# Patient Record
Sex: Female | Born: 1960 | Race: White | Hispanic: No | Marital: Single | Smoking: Current every day smoker
Health system: Southern US, Community
[De-identification: ages and names within clinical notes are randomized; demographics above are authoritative.]

## PROBLEM LIST (undated history)

## (undated) DIAGNOSIS — J45909 Unspecified asthma, uncomplicated: Secondary | ICD-10-CM

## (undated) DIAGNOSIS — F419 Anxiety disorder, unspecified: Secondary | ICD-10-CM

## (undated) DIAGNOSIS — M479 Spondylosis, unspecified: Secondary | ICD-10-CM

## (undated) DIAGNOSIS — M1990 Unspecified osteoarthritis, unspecified site: Secondary | ICD-10-CM

## (undated) DIAGNOSIS — F32A Depression, unspecified: Secondary | ICD-10-CM

## (undated) DIAGNOSIS — T7840XA Allergy, unspecified, initial encounter: Secondary | ICD-10-CM

## (undated) DIAGNOSIS — F329 Major depressive disorder, single episode, unspecified: Secondary | ICD-10-CM

## (undated) HISTORY — DX: Unspecified asthma, uncomplicated: J45.909

## (undated) HISTORY — DX: Major depressive disorder, single episode, unspecified: F32.9

## (undated) HISTORY — DX: Unspecified osteoarthritis, unspecified site: M19.90

## (undated) HISTORY — DX: Allergy, unspecified, initial encounter: T78.40XA

## (undated) HISTORY — DX: Anxiety disorder, unspecified: F41.9

## (undated) HISTORY — DX: Depression, unspecified: F32.A

---

## 1998-04-11 HISTORY — PX: TOTAL ABDOMINAL HYSTERECTOMY: SHX209

## 2004-07-29 ENCOUNTER — Emergency Department: Payer: Self-pay | Admitting: Emergency Medicine

## 2014-03-31 ENCOUNTER — Emergency Department: Payer: Self-pay | Admitting: Emergency Medicine

## 2014-03-31 LAB — URINALYSIS, COMPLETE
BACTERIA: NONE SEEN
BILIRUBIN, UR: NEGATIVE
Glucose,UR: NEGATIVE mg/dL (ref 0–75)
Ketone: NEGATIVE
Leukocyte Esterase: NEGATIVE
Nitrite: NEGATIVE
PROTEIN: NEGATIVE
Ph: 6 (ref 4.5–8.0)
SPECIFIC GRAVITY: 1.005 (ref 1.003–1.030)
WBC UR: NONE SEEN /HPF (ref 0–5)

## 2014-12-12 ENCOUNTER — Other Ambulatory Visit: Payer: Self-pay | Admitting: Family Medicine

## 2014-12-12 DIAGNOSIS — M1990 Unspecified osteoarthritis, unspecified site: Secondary | ICD-10-CM | POA: Insufficient documentation

## 2014-12-12 DIAGNOSIS — K529 Noninfective gastroenteritis and colitis, unspecified: Secondary | ICD-10-CM | POA: Insufficient documentation

## 2014-12-12 DIAGNOSIS — J9801 Acute bronchospasm: Secondary | ICD-10-CM | POA: Insufficient documentation

## 2014-12-12 DIAGNOSIS — M549 Dorsalgia, unspecified: Secondary | ICD-10-CM

## 2014-12-12 DIAGNOSIS — Z8541 Personal history of malignant neoplasm of cervix uteri: Secondary | ICD-10-CM | POA: Insufficient documentation

## 2014-12-12 DIAGNOSIS — G8929 Other chronic pain: Secondary | ICD-10-CM | POA: Insufficient documentation

## 2014-12-12 DIAGNOSIS — M5412 Radiculopathy, cervical region: Secondary | ICD-10-CM | POA: Insufficient documentation

## 2014-12-12 DIAGNOSIS — F329 Major depressive disorder, single episode, unspecified: Secondary | ICD-10-CM | POA: Insufficient documentation

## 2014-12-12 DIAGNOSIS — B009 Herpesviral infection, unspecified: Secondary | ICD-10-CM | POA: Insufficient documentation

## 2014-12-12 DIAGNOSIS — F419 Anxiety disorder, unspecified: Secondary | ICD-10-CM

## 2014-12-12 DIAGNOSIS — F17210 Nicotine dependence, cigarettes, uncomplicated: Secondary | ICD-10-CM | POA: Insufficient documentation

## 2014-12-12 DIAGNOSIS — K219 Gastro-esophageal reflux disease without esophagitis: Secondary | ICD-10-CM | POA: Insufficient documentation

## 2014-12-13 ENCOUNTER — Ambulatory Visit (INDEPENDENT_AMBULATORY_CARE_PROVIDER_SITE_OTHER): Payer: BLUE CROSS/BLUE SHIELD | Admitting: Family Medicine

## 2014-12-13 ENCOUNTER — Encounter: Payer: Self-pay | Admitting: Family Medicine

## 2014-12-13 VITALS — BP 108/60 | HR 107 | Temp 98.2°F | Resp 16 | Ht 64.0 in | Wt 144.2 lb

## 2014-12-13 DIAGNOSIS — M5412 Radiculopathy, cervical region: Secondary | ICD-10-CM

## 2014-12-13 DIAGNOSIS — F418 Other specified anxiety disorders: Secondary | ICD-10-CM

## 2014-12-13 DIAGNOSIS — F419 Anxiety disorder, unspecified: Principal | ICD-10-CM

## 2014-12-13 DIAGNOSIS — F329 Major depressive disorder, single episode, unspecified: Secondary | ICD-10-CM

## 2014-12-13 DIAGNOSIS — F17210 Nicotine dependence, cigarettes, uncomplicated: Secondary | ICD-10-CM

## 2014-12-13 DIAGNOSIS — Z720 Tobacco use: Secondary | ICD-10-CM | POA: Diagnosis not present

## 2014-12-13 DIAGNOSIS — G8929 Other chronic pain: Secondary | ICD-10-CM

## 2014-12-13 DIAGNOSIS — Z1231 Encounter for screening mammogram for malignant neoplasm of breast: Secondary | ICD-10-CM | POA: Insufficient documentation

## 2014-12-13 DIAGNOSIS — M549 Dorsalgia, unspecified: Secondary | ICD-10-CM

## 2014-12-13 MED ORDER — TRAMADOL HCL 50 MG PO TABS: 50.0000 mg | ORAL_TABLET | Freq: Four times a day (QID) | ORAL | Status: DC | PRN

## 2014-12-13 NOTE — Progress Notes (Signed)
Name: Tracy Winters   MRN: [id number]    DOB: Aug 22, 1960   Date:12/13/2014       Progress Note  Subjective  Chief Complaint  Chief Complaint  Patient presents with  . Anxiety    refill of medication  . Numbness    bilateral hands and radiates to elbow at times  . Back Pain    refill of medication    HPI  Patient is here for routine follow up of Asthma. Age of onset? Childhood Asthma triggers? Environmental, URIs Current Asthma medication regimen? Albuterol rescue inhaler only when needed Using medications as instructed? yes Feels symptoms are well controlled? yes Night time symptoms? no  ER visits since last visit: no Missed work or school: no Increased wheezing: no Increased cough: no Increased SOB: no  The patient is here today to discuss their joint pain. Is this a chronic problem or an acute problem? Chronic back pain. More recently having some numbness and tingling in bilateral hands/arms. When did the pain start? Years ago. Where is the pain located? Lower back How would you describe the pain? Constant dull ache. Does the pain radiate beyond place of origin? No During a typical day what is the pattern of the pain? Some days better than others. What makes the pain better? Rest, medication. What makes the pain worse? Increased activity, increased stress. Is patient able to function at home, work, school? Yes Is it getting better, worse or staying the same? Staying the same. What medications have you tried to help manage your pain? NSAIDs, opioids, tramadol.   Have you have X-rays, MRI, CT Scan related to the area of pain? Yes X-rays   Patient is also here to discuss mood disorder.  In general symptoms are stable, reasonably well controlled and no significant medication side effects noted. Depression symptoms include feeling down, poor concentration, anxiety, worrying. Anxiety symptoms include difficulty concentrating, feelings of losing control, irritable.  Any  family history of depression? yes What medications has the patient tried for mood disorder? Currently on Celexa and Ativan as needed. Any current suicidal or homicidal ideations? no  Would also like a referral for her mammogram.    Patient Active Problem List   Diagnosis Date Noted  . Anxiety and depression 12/12/2014  . 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 Acid reflux 12/12/2014  . Cigarette smoker 12/12/2014  . Back pain, chronic 12/12/2014  . Arthritis 12/12/2014  . Radiculopathy of cervical spine 12/12/2014  . Cancer 12/12/2014  . Acute bronchospasm 12/12/2014  . Acute gastroenteritis 12/12/2014    History  Substance Use Topics  . Smoking status: Current Every Day Smoker    Types: Cigarettes  . Smokeless tobacco: Not on file  . Alcohol Use: No     Current outpatient prescriptions:  .  albuterol (PROVENTIL HFA;VENTOLIN HFA) 108 (90 BASE) MCG/ACT inhaler, Inhale 1-2 puffs into the lungs every 6 (six) hours as needed., Disp: , Rfl:  .  citalopram (CELEXA) 20 MG tablet, Take 1 tablet by mouth daily., Disp: , Rfl:  .  LORazepam (ATIVAN) 0.5 MG tablet, Take 1 tablet by mouth 2 (two) times daily as needed., Disp: , Rfl:  .  traMADol (ULTRAM) 50 MG tablet, Take 1 tablet by mouth every 6 (six) hours as needed., Disp: , Rfl:   Past Surgical History  Procedure Laterality Date  . Total abdominal hysterectomy  04/1998    Family History  Problem Relation Age of Onset  . Alcohol abuse Mother   .  COPD Mother   . Depression Mother   . Alcohol abuse Father   . Arthritis Father   . Diabetes Father   . Heart disease Father   . Hypertension Father   . Drug abuse Sister   . Hearing loss Son   . Heart disease Maternal Aunt   . Hyperlipidemia Maternal Aunt   . Asthma Paternal Aunt   . Cancer Paternal Aunt   . Diabetes Paternal Aunt   . Hypertension Paternal Aunt   . Stroke Paternal Aunt   . Arthritis Maternal Grandfather   . Heart disease Maternal Grandfather   .  Hypertension Maternal Grandfather   . Asthma Paternal Grandmother   . Diabetes Paternal Grandmother   . Heart disease Paternal Grandmother   . Stroke Paternal Grandmother   . Heart disease Paternal Grandfather     Allergies  Allergen Reactions  . Sulfa Antibiotics Swelling and Anaphylaxis    swollen throat  . Tomato Hives    Patient stated that she can eat tomatoes in foods but she cannot eat a plain tomato.     Review of Systems  CONSTITUTIONAL: No significant weight changes, fever, chills, weakness.  HEENT:  - Eyes: No visual changes.  - Ears: No auditory changes. No pain.  - Nose: No sneezing, congestion, runny nose. - Throat: No sore throat. No changes in swallowing. SKIN: No rash or itching.  CARDIOVASCULAR: No chest pain, chest pressure or chest discomfort. No palpitations or edema.  RESPIRATORY: No shortness of breath, cough or sputum.  NEUROLOGICAL: No headache, dizziness, syncope, paralysis, ataxia. Positive for numbness or tingling in the upper extremities. No memory changes. No change in bowel or bladder control.  MUSCULOSKELETAL: Chronic back joint pain. No muscle pain. HEMATOLOGIC: No anemia, bleeding or bruising.  LYMPHATICS: No enlarged lymph nodes.  PSYCHIATRIC: No change in mood more than usual. No change in sleep pattern.  ENDOCRINOLOGIC: No reports of sweating, cold or heat intolerance. No polyuria or polydipsia.      Objective  BP 108/60 mmHg  Pulse 107  Temp(Src) 98.2 F (36.8 C) (Oral)  Resp 16  Ht 5\' 4"  (1.626 m)  Wt 144 lb 3.2 oz (65.409 kg)  BMI 24.74 kg/m2  SpO2 96%  Physical Exam  Constitutional: Patient appears well-developed and well-nourished. In no distress.  HEENT:  - Head: Normocephalic and atraumatic.  - Ears: Bilateral TMs gray, no erythema or effusion - Nose: Nasal mucosa moist - Mouth/Throat: Oropharynx is clear and moist. No tonsillar hypertrophy or erythema. No post nasal drainage.  - Eyes: Conjunctivae clear, EOM  movements normal. PERRLA. No scleral icterus.  Neck: Normal range of motion. Neck supple. No JVD present. No thyromegaly present.  Cardiovascular: Normal rate, regular rhythm and normal heart sounds.  No murmur heard.  Pulmonary/Chest: Effort normal and breath sounds normal. No respiratory distress. Musculoskeletal: Normal range of motion bilateral UE and LE, no joint effusions. Continues to have some lower lumbar paraspinal muscle tension. Peripheral vascular: Bilateral LE no edema. Neurological: CN II-XII grossly intact with no focal deficits. Positive Phalen's test bilaterally. Alert and oriented to person, place, and time. Coordination, balance, strength, speech and gait are normal.  Skin: Skin is warm and dry. No rash noted. No erythema.  Psychiatric: Patient's mood and affect are stable. Behavior is normal in office today. Judgment and thought content normal in office today.    Assessment & Plan  1. Anxiety and depression Well controlled on Citalopram 20mg  daily and Ativan 0.5mg  po bit prn. No refills  needed today.  2. Cigarette smoker The patient has been counseled on smoking cessation benefits, goals, strategies and available over the counter and prescription medications that may help them in their efforts.  Options discussed include Nicoderm patches, Wellbutrin and Chantix.  The patient voices understanding their increased risk of cardiovascular and pulmonary diseases with continued use of tobacco products.   3. Back pain, chronic We discussed potential pathology and long term risk of reoccurrence. Maintaining an ideal body habitus, regular exercise, proper lifting techniques and mindfulness of exacerbating factors will be useful in long term management.  Instructed on use of heating pad with exercises. Consider concomitant therapy with PT, massage therapist or chiropractor. May use anti-inflammatory medication and muscle relaxer as needed.  4. Radiculopathy of cervical  spine Numbness in hands may be a manifestation of long standing C-spine issues. Continue conservative therapy.  5. Encounter for screening mammogram for breast cancer  - MM DIGITAL SCREENING BILATERAL; Future

## 2014-12-20 ENCOUNTER — Ambulatory Visit: Payer: Self-pay | Attending: Family Medicine

## 2014-12-24 ENCOUNTER — Telehealth: Payer: Self-pay | Admitting: Family Medicine

## 2014-12-24 NOTE — Telephone Encounter (Signed)
Please contact patient at your convenience and let me know how I may be of service.

## 2014-12-24 NOTE — Telephone Encounter (Signed)
Pt is requesting a return call [phone]

## 2014-12-25 ENCOUNTER — Other Ambulatory Visit: Payer: Self-pay

## 2014-12-25 DIAGNOSIS — M5440 Lumbago with sciatica, unspecified side: Secondary | ICD-10-CM

## 2014-12-25 MED ORDER — CYCLOBENZAPRINE HCL 10 MG PO TABS: 10.0000 mg | ORAL_TABLET | Freq: Three times a day (TID) | ORAL | Status: DC | PRN

## 2014-12-25 NOTE — Telephone Encounter (Signed)
Rx was electronically submitted to [facility] in [place], [place]

## 2014-12-25 NOTE — Telephone Encounter (Signed)
A week ago last Sunday, patient pulled some muscles in her back and was taken out of work (worker's comp). Patient was given a rx for Flexeril 10mg  and since it helped she wanted to know if Dr. Sherley Bounds could send in another rx for to [facility] in [place]. After consulting with Dr. Sherley Bounds, a rx was electronically submitted. Patient was informed

## 2015-03-12 ENCOUNTER — Ambulatory Visit (INDEPENDENT_AMBULATORY_CARE_PROVIDER_SITE_OTHER): Payer: BLUE CROSS/BLUE SHIELD | Admitting: Family Medicine

## 2015-03-12 ENCOUNTER — Encounter: Payer: Self-pay | Admitting: Family Medicine

## 2015-03-12 VITALS — BP 130/76 | HR 103 | Temp 98.4°F | Resp 18 | Wt 152.7 lb

## 2015-03-12 DIAGNOSIS — G8929 Other chronic pain: Secondary | ICD-10-CM

## 2015-03-12 DIAGNOSIS — S29019D Strain of muscle and tendon of unspecified wall of thorax, subsequent encounter: Secondary | ICD-10-CM

## 2015-03-12 DIAGNOSIS — F418 Other specified anxiety disorders: Secondary | ICD-10-CM

## 2015-03-12 DIAGNOSIS — R319 Hematuria, unspecified: Secondary | ICD-10-CM | POA: Diagnosis not present

## 2015-03-12 DIAGNOSIS — M549 Dorsalgia, unspecified: Secondary | ICD-10-CM

## 2015-03-12 DIAGNOSIS — F419 Anxiety disorder, unspecified: Secondary | ICD-10-CM

## 2015-03-12 DIAGNOSIS — R911 Solitary pulmonary nodule: Secondary | ICD-10-CM | POA: Diagnosis not present

## 2015-03-12 DIAGNOSIS — F329 Major depressive disorder, single episode, unspecified: Secondary | ICD-10-CM

## 2015-03-12 DIAGNOSIS — S29012D Strain of muscle and tendon of back wall of thorax, subsequent encounter: Secondary | ICD-10-CM

## 2015-03-12 DIAGNOSIS — S29012A Strain of muscle and tendon of back wall of thorax, initial encounter: Secondary | ICD-10-CM | POA: Insufficient documentation

## 2015-03-12 DIAGNOSIS — Z716 Tobacco abuse counseling: Secondary | ICD-10-CM

## 2015-03-12 DIAGNOSIS — Z720 Tobacco use: Secondary | ICD-10-CM

## 2015-03-12 DIAGNOSIS — F32A Depression, unspecified: Secondary | ICD-10-CM

## 2015-03-12 MED ORDER — LORAZEPAM 0.5 MG PO TABS: 0.5000 mg | ORAL_TABLET | Freq: Two times a day (BID) | ORAL | Status: DC | PRN

## 2015-03-12 MED ORDER — OXYCODONE-ACETAMINOPHEN 5-325 MG PO TABS: 1.0000 | ORAL_TABLET | Freq: Three times a day (TID) | ORAL | Status: DC | PRN

## 2015-03-12 MED ORDER — CYCLOBENZAPRINE HCL 10 MG PO TABS: 10.0000 mg | ORAL_TABLET | Freq: Three times a day (TID) | ORAL | Status: DC | PRN

## 2015-03-12 MED ORDER — TRAMADOL HCL 50 MG PO TABS: 50.0000 mg | ORAL_TABLET | Freq: Four times a day (QID) | ORAL | Status: DC | PRN

## 2015-03-12 MED ORDER — NICOTINE 14 MG/24HR TD PT24: 14.0000 mg | MEDICATED_PATCH | Freq: Every day | TRANSDERMAL | Status: DC

## 2015-03-12 NOTE — Progress Notes (Signed)
Name: Tracy Winters   MRN: [id number]    DOB: [birth year]   Date:03/12/2015       Progress Note  Subjective  Chief Complaint  Chief Complaint  Patient presents with  . Back Pain    patient is here for a ER follow-up on her lower lumbar strain  . Results    patient was told while at hospital that she has a 4mm nodule on her left lung.    HPI  Tracy Winters is a 54 year old female with recent [facility] ER visit for back pain and was incidentally found to have a lung nodule.  Spiral CT chest results show minimal bibasilar groundglass opacities with a 4 mm left basilar nodule.   Because [employer] is an active long time cigarette smoker she is at increased risk for lung cancer. She is very interested in quitting smoking. Currently up to 1/2 a ppd and would like to try nicotine patches. Lab work in the ER was essentially unremarkable aside from hyperglycemia at glucose 137 mg/dL and UA abnormal values including hematuria. Ucx grew mixed urogenital flora. 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  This back pain problem has been on and off for over 1 year now. Recently more exacerbated with left flank pain, unable to put on socks. No radiation of pain or focal neurological deficits.   Patient complains of stable anxiety disorder, panic attacks and sleep disturbance.  She has the following symptoms: difficulty concentrating, fatigue, feelings of losing control, insomnia, irritable, psychomotor agitation. Onset of symptoms was approximately several years ago, stable since that time. She denies current suicidal and homicidal ideation. Family history significant for anxiety and depression.Possible organic causes contributing are: medications. Risk factors: previous episode of depression Previous treatment includes Ativan and Celexa and medication.  She complains of the following side effects from the treatment: none.    Patient  Active Problem List   Diagnosis Date Noted  . Encounter for screening mammogram for breast cancer 12/13/2014  . Anxiety and depression 12/12/2014  . 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 Acid reflux 12/12/2014  . Cigarette smoker 12/12/2014  . Back pain, chronic 12/12/2014  . Arthritis 12/12/2014  . Radiculopathy of cervical spine 12/12/2014  . History of cervical cancer 12/12/2014  . Acute bronchospasm 12/12/2014  . Acute gastroenteritis 12/12/2014    Social History  Substance Use Topics  . Smoking status: Current Every Day Smoker    Types: Cigarettes  . Smokeless tobacco: Not on file  . Alcohol Use: No     Current outpatient prescriptions:  .  ibuprofen (ADVIL,MOTRIN) 800 MG tablet, Take 800 mg by mouth., Disp: , Rfl:  .  oxyCODONE-acetaminophen (PERCOCET/ROXICET) 5-325 MG per tablet, Take by mouth., Disp: , Rfl:  .  albuterol (PROVENTIL HFA;VENTOLIN HFA) 108 (90 BASE) MCG/ACT inhaler, Inhale 1-2 puffs into the lungs every 6 (six) hours as needed., Disp: , Rfl:  .  citalopram (CELEXA) 20 MG tablet, Take 1 tablet by mouth daily., Disp: , Rfl:  .  cyclobenzaprine (FLEXERIL) 10 MG tablet, Take 1 tablet (10 mg total) by mouth 3 (three) times daily as needed for muscle spasms., Disp: 50 tablet, Rfl: 2 .  LORazepam (ATIVAN) 0.5 MG tablet, Take 1 tablet by mouth 2 (two) times daily as needed., Disp: , Rfl:  .  traMADol (ULTRAM) 50 MG tablet, Take 1 tablet (50 mg total) by mouth every 6 (  six) hours as needed for severe pain., Disp: 120 tablet, Rfl: 0  Past Surgical History  Procedure Laterality Date  . Total abdominal hysterectomy  04/1998    Family History  Problem Relation Age of Onset  . Alcohol abuse Mother   . COPD Mother   . Depression Mother   . Alcohol abuse Father   . Arthritis Father   . Diabetes Father   . Heart disease Father   . Hypertension Father   . Drug abuse Sister   . Hearing loss Son   . Heart disease Maternal Aunt   . Hyperlipidemia Maternal  Aunt   . Asthma Paternal Aunt   . Cancer Paternal Aunt   . Diabetes Paternal Aunt   . Hypertension Paternal Aunt   . Stroke Paternal Aunt   . Arthritis Maternal Grandfather   . Heart disease Maternal Grandfather   . Hypertension Maternal Grandfather   . Asthma Paternal Grandmother   . Diabetes Paternal Grandmother   . Heart disease Paternal Grandmother   . Stroke Paternal Grandmother   . Heart disease Paternal Grandfather     Allergies  Allergen Reactions  . Sulfa Antibiotics Swelling and Anaphylaxis    swollen throat  . Tomato Hives    Patient stated that she can eat tomatoes in foods but she cannot eat a plain tomato.     Review of Systems  CONSTITUTIONAL: No significant weight changes, fever, chills, weakness or fatigue.  HEENT:  - Eyes: No visual changes.  - Ears: No auditory changes. No pain.  - Nose: No sneezing, congestion, runny nose. - Throat: No sore throat. No changes in swallowing. SKIN: No rash or itching.  CARDIOVASCULAR: No chest pain, chest pressure or chest discomfort. No palpitations or edema.  RESPIRATORY: No shortness of breath, cough or sputum.  GASTROINTESTINAL: No anorexia, nausea, vomiting. No changes in bowel habits. No abdominal pain or blood.  GENITOURINARY: No dysuria. No frequency. No discharge. NEUROLOGICAL: No headache, dizziness, syncope, paralysis, ataxia, numbness or tingling in the extremities. No memory changes. No change in bowel or bladder control.  MUSCULOSKELETAL: Yes back joint pain. No muscle pain. HEMATOLOGIC: No anemia, bleeding or bruising.  LYMPHATICS: No enlarged lymph nodes.  PSYCHIATRIC: No change in mood. No change in sleep pattern.  ENDOCRINOLOGIC: No reports of sweating, cold or heat intolerance. No polyuria or polydipsia.     Objective  BP 130/76 mmHg  Pulse 103  Temp(Src) 98.4 F (36.9 C) (Oral)  Resp 18  Wt 152 lb 11.2 oz (69.264 kg)  SpO2 95% Body mass index is 26.2 kg/(m^2).  Physical  Exam  Constitutional: Patient appears well-developed and well-nourished. In no distress.  HEENT:  - Head: Normocephalic and atraumatic.  - Ears: Bilateral TMs gray, no erythema or effusion - Nose: Nasal mucosa moist - Mouth/Throat: Oropharynx is clear and moist. No tonsillar hypertrophy or erythema. No post nasal drainage.  - Eyes: Conjunctivae clear, EOM movements normal. PERRLA. No scleral icterus.  Neck: Normal range of motion. Neck supple. No JVD present. No thyromegaly present.  Cardiovascular: Normal rate, regular rhythm and normal heart sounds.  No murmur heard.  Pulmonary/Chest: Effort normal and breath sounds normal. No respiratory distress. Musculoskeletal: Normal range of motion bilateral UE with left lower thoracic T11-T12 area paraspinal muscle strain and tension. Neurological: CN II-XII grossly intact with no focal deficits. Alert and oriented to person, place, and time. Coordination, balance, strength, speech and gait are normal.  Skin: Skin is warm and dry. No rash noted. No erythema.  Psychiatric: Patient has a stable anxious mood and affect. Behavior is normal in office today. Judgment and thought content normal in office today.   Assessment & Plan  1. Back pain, chronic Long standing problem, more exacerbations due to work function. Letter given to restrict working with no more than 50 lbs.   - oxyCODONE-acetaminophen (PERCOCET/ROXICET) 5-325 MG per tablet; Take 1 tablet by mouth every 8 (eight) hours as needed for severe pain.  Dispense: 30 tablet; Refill: 0 - cyclobenzaprine (FLEXERIL) 10 MG tablet; Take 1 tablet (10 mg total) by mouth 3 (three) times daily as needed for muscle spasms.  Dispense: 60 tablet; Refill: 5 - traMADol (ULTRAM) 50 MG tablet; Take 1 tablet (50 mg total) by mouth every 6 (six) hours as needed for severe pain.  Dispense: 120 tablet; Refill: 0  2. Incidental lung nodule, > 3mm and < 8mm Discussed findings and plan to repeat CT Chest in 3  months.  3. Strain of thoracic paraspinal muscles excluding T1 and T2 levels, subsequent encounter We discussed potential pathology and long term risk of reoccurrence. Maintaining an ideal body habitus, regular exercise, proper lifting techniques and mindfulness of exacerbating factors will be useful in long term management.  Instructed on use of heating pad with exercises. Consider concomitant therapy with PT, massage therapist or chiropractor. May use anti-inflammatory medication and muscle relaxer as needed. If no improvement return for trigger point injection assessment.   - oxyCODONE-acetaminophen (PERCOCET/ROXICET) 5-325 MG per tablet; Take 1 tablet by mouth every 8 (eight) hours as needed for severe pain.  Dispense: 30 tablet; Refill: 0 - cyclobenzaprine (FLEXERIL) 10 MG tablet; Take 1 tablet (10 mg total) by mouth 3 (three) times daily as needed for muscle spasms.  Dispense: 60 tablet; Refill: 5  4. Anxiety and depression Likely viral etiology. Instructed patient on increasing hydration, nasal saline spray, steam inhalation, NSAID if tolerated and not contraindicated. If not already doing so start taking daily anti-histamine and use a steroid nasal spray. If symptoms persist/worsen may consider antibiotic therapy.  - LORazepam (ATIVAN) 0.5 MG tablet; Take 1 tablet (0.5 mg total) by mouth 2 (two) times daily as needed.  Dispense: 30 tablet; Refill: 5  5. Hematuria Patient left office before supplying urine sample to recheck hematuria seen in ER.   6. Encounter for smoking cessation counseling The patient has been counseled on smoking cessation benefits, goals, strategies and available over the counter and prescription medications that may help them in their efforts.  Options discussed include Nicoderm patches, Wellbutrin and Chantix.  The patient voices understanding their increased risk of cardiovascular and pulmonary diseases with continued use of tobacco products.  - Nicotine 14mg /24 hr  patch, one a day transdermal, #28 , 2 refills

## 2015-03-12 NOTE — Patient Instructions (Signed)
Thoracic Strain You have injured the muscles or tendons that attach to the upper part of your back behind your chest. This injury is called a thoracic strain, thoracic sprain, or mid-back strain.  CAUSES  The cause of thoracic strain varies. A less severe injury involves pulling a muscle or tendon without tearing it. A more severe injury involves tearing (rupturing) a muscle or tendon. With less severe injuries, there may be little loss of strength. Sometimes, there are breaks (fractures) in the bones to which the muscles are attached. These fractures are rare, unless there was a direct hit (trauma) or you have weak bones due to osteoporosis or age. Longstanding strains may be caused by overuse or improper form during certain movements. Obesity can also increase your risk for back injuries. Sudden strains may occur due to injury or not warming up properly before exercise. Often, there is no obvious cause for a thoracic strain. SYMPTOMS  The main symptom is pain, especially with movement, such as during exercise. DIAGNOSIS  Your caregiver can usually tell what is wrong by taking an X-ray and doing a physical exam. TREATMENT   Physical therapy may be helpful for recovery. Your caregiver can give you exercises to do or refer you to a physical therapist after your pain improves.  After your pain improves, strengthening and conditioning programs appropriate for your sport or occupation may be helpful.  Always warm up before physical activities or athletics. Stretching after physical activity may also help.  Certain over-the-counter medicines may also help. Ask your caregiver if there are medicines that would help you. If this is your first thoracic strain injury, proper care and proper healing time before starting activities should prevent long-term problems. Torn ligaments and tendons require as long to heal as broken bones. Average healing times may be only 1 week for a mild strain. For torn muscles  and tendons, healing time may be up to 6 weeks to 2 months. HOME CARE INSTRUCTIONS   Apply ice to the injured area. Ice massages may also be used as directed.  Put ice in a plastic bag.  Place a towel between your skin and the bag.  Leave the ice on for 15-20 minutes, 03-04 times a day, for the first 2 days.  Only take over-the-counter or prescription medicines for pain, discomfort, or fever as directed by your caregiver.  Keep your appointments for physical therapy if this was prescribed.  Use wraps and back braces as instructed. SEEK IMMEDIATE MEDICAL CARE IF:   You have an increase in bruising, swelling, or pain.  Your pain has not improved with medicines.  You develop new shortness of breath, chest pain, or fever.  Problems seem to be getting worse rather than better. MAKE SURE YOU:   Understand these instructions.  Will watch your condition.  Will get help right away if you are not doing well or get worse. Document Released: 09/18/2003 Document Revised: 09/20/2011 Document Reviewed: 08/14/2010 [employer] Patient Information 2015 [employer]. This information is not intended to replace advice given to you by your health care provider. Make sure you discuss any questions you have with your health care provider.  

## 2015-03-18 ENCOUNTER — Ambulatory Visit: Payer: BLUE CROSS/BLUE SHIELD | Admitting: Family Medicine

## 2015-03-18 ENCOUNTER — Other Ambulatory Visit: Payer: Self-pay | Admitting: Family Medicine

## 2015-03-18 DIAGNOSIS — M5416 Radiculopathy, lumbar region: Principal | ICD-10-CM

## 2015-03-18 DIAGNOSIS — G8929 Other chronic pain: Secondary | ICD-10-CM

## 2015-03-31 ENCOUNTER — Ambulatory Visit (INDEPENDENT_AMBULATORY_CARE_PROVIDER_SITE_OTHER): Payer: BLUE CROSS/BLUE SHIELD | Admitting: Family Medicine

## 2015-03-31 ENCOUNTER — Encounter: Payer: Self-pay | Admitting: Family Medicine

## 2015-03-31 VITALS — BP 118/82 | HR 107 | Temp 98.2°F | Resp 18 | Wt 148.2 lb

## 2015-03-31 DIAGNOSIS — J069 Acute upper respiratory infection, unspecified: Secondary | ICD-10-CM | POA: Diagnosis not present

## 2015-03-31 DIAGNOSIS — B9789 Other viral agents as the cause of diseases classified elsewhere: Principal | ICD-10-CM

## 2015-03-31 NOTE — Progress Notes (Signed)
Name: Tracy Winters   MRN: [id number]    DOB: Nov 10, 1960   Date:03/31/2015       Progress Note  Subjective  Chief Complaint  Chief Complaint  Patient presents with  . Influenza    patient's grandson recently tested positive for 2 different strands of the flu.    HPI  Patient is here today with concerns regarding the following symptoms sore throat, sneezing, non productive cough, sinus pressure, achiness and low grade fevers that started 3 days ago.  Associated with fevers, chills, fatigue and malaise. Has tried the following home remedies: Zyrtec and Tylenol. Patient's oldest grandson (64yrs old) tested positive for 2 different strands of flu and was admitted to [facility] on Friday and was released on Saturday.  She is also have some lose bowel movements. Not associated with myalgias, rash.  Past Medical History  Diagnosis Date  . Anxiety   . Arthritis   . Asthma   . Depression   . Allergy     patient takes otc Zyrtec & uses inhaler PRN    Social History  Substance Use Topics  . Smoking status: Current Every Day Smoker    Types: Cigarettes  . Smokeless tobacco: Not on file  . Alcohol Use: No     Current outpatient prescriptions:  .  albuterol (PROVENTIL HFA;VENTOLIN HFA) 108 (90 BASE) MCG/ACT inhaler, Inhale 1-2 puffs into the lungs every 6 (six) hours as needed., Disp: , Rfl:  .  citalopram (CELEXA) 20 MG tablet, Take 1 tablet by mouth daily., Disp: , Rfl:  .  cyclobenzaprine (FLEXERIL) 10 MG tablet, Take 1 tablet (10 mg total) by mouth 3 (three) times daily as needed for muscle spasms., Disp: 60 tablet, Rfl: 5 .  cyclobenzaprine (FLEXERIL) 10 MG tablet, TAKE 1 TABLET (10 MG TOTAL) BY MOUTH 3 (THREE) TIMES DAILY AS NEEDED FOR MUSCLE SPASMS., Disp: 50 tablet, Rfl: 5 .  LORazepam (ATIVAN) 0.5 MG tablet, Take 1 tablet (0.5 mg total) by mouth 2 (two) times daily as needed., Disp: 30 tablet, Rfl: 5 .  nicotine (NICODERM CQ - DOSED IN MG/24 HOURS) 14 mg/24hr patch, Place 1 patch (14 mg  total) onto the skin daily., Disp: 28 patch, Rfl: 2 .  oxyCODONE-acetaminophen (PERCOCET/ROXICET) 5-325 MG per tablet, Take 1 tablet by mouth every 8 (eight) hours as needed for severe pain., Disp: 30 tablet, Rfl: 0 .  traMADol (ULTRAM) 50 MG tablet, Take 1 tablet (50 mg total) by mouth every 6 (six) hours as needed for severe pain., Disp: 120 tablet, Rfl: 0  Allergies  Allergen Reactions  . Sulfa Antibiotics Swelling and Anaphylaxis    swollen throat  . Tomato Hives    Patient stated that she can eat tomatoes in foods but she cannot eat a plain tomato.    ROS  Positive for fatigue, nasal congestion, sinus pressure, ear fullness, cough as mentioned in HPI, otherwise all systems reviewed and are negative.  Objective  Filed Vitals:   03/31/15 1456  BP: 118/82  Pulse: 107  Temp: 98.2 F (36.8 C)  TempSrc: Oral  Resp: 18  Weight: 148 lb 3.2 oz (67.223 kg)  SpO2: 98%   Body mass index is 25.43 kg/(m^2).   Physical Exam  Constitutional: Patient appears well-developed and well-nourished. In no acute distress but does appear to be fatigued from acute illness. HEENT:  - Head: Normocephalic and atraumatic.  - Ears: RIGHT TM pearly grey with no effusions, LEFT TM pearly grey with no effusions. - Nose: Nasal mucosa  boggy and congested.  - Mouth/Throat: Oropharynx is moist without erythema of bilateral tonsils. No hypertrophy or exudates. Post nasal drainage present.  - Eyes: Conjunctivae clear, EOM movements normal. PERRLA. No scleral icterus.  Neck: Normal range of motion. Neck supple. No JVD present. No thyromegaly present. No local lymphadenopathy. Cardiovascular: Regular rate, regular rhythm with no murmurs heard.  Pulmonary/Chest: Effort normal and breath sounds clear in all lung fields.  Abdomen: Soft, non tender, non distended, normal bowel sounds in all four quadrants. Skin: Skin is warm and dry. No rash noted. Psychiatric: Patient has a normal mood and affect. Behavior is  normal in office today. Judgment and thought content normal in office today.   Assessment & Plan  1. Viral URI with cough Etiologies include allergic rhinitis with viral syndrome. Unlikely influenza, she is afebrile and has not taking NSAID today. Instructed patient on increasing hydration, nasal saline spray, steam inhalation, NSAID if tolerated and not contraindicated. Modified diet advance as tolerated. If symptoms persist/worsen may call me.

## 2015-03-31 NOTE — Patient Instructions (Signed)

## 2015-04-08 ENCOUNTER — Telehealth: Payer: Self-pay

## 2015-04-08 ENCOUNTER — Other Ambulatory Visit: Payer: Self-pay | Admitting: Family Medicine

## 2015-04-08 DIAGNOSIS — G8929 Other chronic pain: Secondary | ICD-10-CM

## 2015-04-08 DIAGNOSIS — M549 Dorsalgia, unspecified: Principal | ICD-10-CM

## 2015-04-08 DIAGNOSIS — S29012D Strain of muscle and tendon of back wall of thorax, subsequent encounter: Secondary | ICD-10-CM

## 2015-04-08 MED ORDER — TRAMADOL HCL 50 MG PO TABS: 50.0000 mg | ORAL_TABLET | Freq: Four times a day (QID) | ORAL | Status: DC | PRN

## 2015-04-08 MED ORDER — OXYCODONE-ACETAMINOPHEN 5-325 MG PO TABS: 1.0000 | ORAL_TABLET | Freq: Three times a day (TID) | ORAL | Status: DC | PRN

## 2015-04-08 NOTE — Telephone Encounter (Signed)
Tried to contact this patient to inform her that her rx was ready for pick up and that Dr. Sherley Bounds stated she will not prescribe any more narcotics so if she needs more she will have to be referred to a pain clinic, but there was no answer. Her voicemail has not bee set up yet.

## 2015-04-08 NOTE — Telephone Encounter (Signed)
Patient is not completely out of Tramadol but is requesting a refill.  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 She was told that if she needed another refill to just call the office. Patient is requesting these two prescripitons

## 2015-04-08 NOTE — Telephone Encounter (Signed)
Refill request was sent to Dr. Ashany Sundaram for approval and submission.  

## 2015-05-14 ENCOUNTER — Other Ambulatory Visit: Payer: Self-pay | Admitting: Family Medicine

## 2015-05-14 DIAGNOSIS — G8929 Other chronic pain: Secondary | ICD-10-CM

## 2015-05-14 DIAGNOSIS — M549 Dorsalgia, unspecified: Principal | ICD-10-CM

## 2015-05-14 NOTE — Telephone Encounter (Signed)
Requesting refill on Tramadol. Please call once ready [phone]

## 2015-05-14 NOTE — Telephone Encounter (Signed)
Refill request was sent to Dr. Ashany Sundaram for approval and submission.  

## 2015-05-15 MED ORDER — TRAMADOL HCL 50 MG PO TABS: 50.0000 mg | ORAL_TABLET | Freq: Four times a day (QID) | ORAL | Status: DC | PRN

## 2015-05-30 ENCOUNTER — Other Ambulatory Visit: Payer: Self-pay | Admitting: Family Medicine

## 2015-08-06 ENCOUNTER — Other Ambulatory Visit: Payer: Self-pay | Admitting: Family Medicine

## 2015-08-06 DIAGNOSIS — M549 Dorsalgia, unspecified: Principal | ICD-10-CM

## 2015-08-06 DIAGNOSIS — G8929 Other chronic pain: Secondary | ICD-10-CM

## 2015-08-06 MED ORDER — TRAMADOL HCL 50 MG PO TABS: 50.0000 mg | ORAL_TABLET | Freq: Four times a day (QID) | ORAL | Status: DC | PRN

## 2015-08-06 NOTE — Telephone Encounter (Signed)
Pt states she would like a refill on Tramodol.

## 2015-09-10 ENCOUNTER — Other Ambulatory Visit: Payer: Self-pay | Admitting: Family Medicine

## 2015-09-10 DIAGNOSIS — G8929 Other chronic pain: Secondary | ICD-10-CM

## 2015-09-10 DIAGNOSIS — M549 Dorsalgia, unspecified: Principal | ICD-10-CM

## 2015-09-10 MED ORDER — TRAMADOL HCL 50 MG PO TABS: 50.0000 mg | ORAL_TABLET | Freq: Two times a day (BID) | ORAL | Status: DC | PRN

## 2015-09-10 NOTE — Telephone Encounter (Signed)
Patient requesting a refill of Tramadol to be called into [facility].

## 2015-09-11 ENCOUNTER — Other Ambulatory Visit: Payer: Self-pay | Admitting: Family Medicine

## 2015-10-14 ENCOUNTER — Ambulatory Visit: Payer: BLUE CROSS/BLUE SHIELD | Admitting: Family Medicine

## 2015-11-03 ENCOUNTER — Telehealth: Payer: Self-pay | Admitting: Family Medicine

## 2015-11-03 NOTE — Telephone Encounter (Signed)
Patient had appointment a couple weeks ago for tramadol refill and missed the appointment. She would like for you to know that she is still coming to see you but because he is self pay she did not have the money to come in. I informed the patient that we can charge her $50 on date of service but the balance would have to be paid before the next office visit. She thanked me and said she would call back to schedule the appointment when she get the $50

## 2015-12-09 ENCOUNTER — Ambulatory Visit: Payer: BLUE CROSS/BLUE SHIELD | Admitting: Family Medicine

## 2015-12-23 ENCOUNTER — Ambulatory Visit: Payer: BLUE CROSS/BLUE SHIELD | Admitting: Family Medicine

## 2016-01-05 ENCOUNTER — Ambulatory Visit: Payer: BLUE CROSS/BLUE SHIELD | Admitting: Family Medicine

## 2016-01-09 ENCOUNTER — Encounter: Payer: Self-pay | Admitting: *Deleted

## 2016-01-09 ENCOUNTER — Emergency Department
Admission: EM | Admit: 2016-01-09 | Discharge: 2016-01-09 | Disposition: A | Discharge status: Home / Self Care | Payer: Self-pay | Attending: Emergency Medicine | Admitting: Emergency Medicine

## 2016-01-09 ENCOUNTER — Emergency Department: Payer: Self-pay

## 2016-01-09 DIAGNOSIS — F329 Major depressive disorder, single episode, unspecified: Secondary | ICD-10-CM | POA: Insufficient documentation

## 2016-01-09 DIAGNOSIS — M5412 Radiculopathy, cervical region: Secondary | ICD-10-CM | POA: Insufficient documentation

## 2016-01-09 DIAGNOSIS — M79671 Pain in right foot: Secondary | ICD-10-CM | POA: Insufficient documentation

## 2016-01-09 DIAGNOSIS — J45909 Unspecified asthma, uncomplicated: Secondary | ICD-10-CM | POA: Insufficient documentation

## 2016-01-09 DIAGNOSIS — F17210 Nicotine dependence, cigarettes, uncomplicated: Secondary | ICD-10-CM | POA: Insufficient documentation

## 2016-01-09 DIAGNOSIS — Z79899 Other long term (current) drug therapy: Secondary | ICD-10-CM | POA: Insufficient documentation

## 2016-01-09 DIAGNOSIS — M1990 Unspecified osteoarthritis, unspecified site: Secondary | ICD-10-CM | POA: Insufficient documentation

## 2016-01-09 DIAGNOSIS — M479 Spondylosis, unspecified: Secondary | ICD-10-CM | POA: Insufficient documentation

## 2016-01-09 HISTORY — DX: Spondylosis, unspecified: M47.9

## 2016-01-09 LAB — BASIC METABOLIC PANEL
Anion gap: 7 (ref 5–15)
BUN: 12 mg/dL (ref 6–20)
CHLORIDE: 105 mmol/L (ref 101–111)
CO2: 27 mmol/L (ref 22–32)
CREATININE: 1.25 mg/dL — AB (ref 0.44–1.00)
Calcium: 8.9 mg/dL (ref 8.9–10.3)
GFR calc Af Amer: 55 mL/min — ABNORMAL LOW (ref >60)
GFR, EST NON AFRICAN AMERICAN: 47 mL/min — AB (ref >60)
GLUCOSE: 118 mg/dL — AB (ref 65–99)
POTASSIUM: 3.8 mmol/L (ref 3.5–5.1)
Sodium: 139 mmol/L (ref 135–145)

## 2016-01-09 LAB — CBC
HCT: 37.1 % (ref 35.0–47.0)
Hemoglobin: 12.6 g/dL (ref 12.0–16.0)
MCH: 30.5 pg (ref 26.0–34.0)
MCHC: 34 g/dL (ref 32.0–36.0)
MCV: 89.8 fL (ref 80.0–100.0)
PLATELETS: 281 10*3/uL (ref 150–440)
RBC: 4.14 MIL/uL (ref 3.80–5.20)
RDW: 13.9 % (ref 11.5–14.5)
WBC: 8.6 10*3/uL (ref 3.6–11.0)

## 2016-01-09 NOTE — ED Notes (Addendum)
Pt reports month long sxs of pain. Pt states week long hx of swelling and increased tenderness w/ difficulty ambulating on R foot and ankle. Pt states week long hx of R sided numbness and tingling for entire R side. Upon assessment, pt has palpable pulses in bilateral DP and PT, R foot is darker and slightly more edematous than L but both feet and ankles have 1+/2+ non-pitting edema.

## 2016-01-09 NOTE — ED Provider Notes (Signed)
[facility] Emergency Department Provider Note   ____________________________________________  Time seen: ~2050  I have reviewed the triage vital signs and the nursing notes.   HISTORY  Chief Complaint Foreign Body and Joint Swelling   History limited by: Not Limited   HPI Tracy Winters is a 55 y.o. female who comes in today with primary complaint of right foot pain. She says that the foot has been hurting for the past month. It hurts in the bottom of her foot. She says she has noticed a little swelling in that area. She does not recall stepping on anything. She has not noticed any drainage. In addition the patient has further complaints of bilateral ankle swelling. Additionally patient states that she has had intermittent episodes of pins and needles in the right side of her body. She denies any recent fevers. Denies any chest pain or shortness of breath.   Past Medical History  Diagnosis Date  . Anxiety   . Arthritis   . Asthma   . Depression   . Allergy     patient takes otc Zyrtec & uses inhaler PRN  . Degenerative joint disease of low back     Patient Active Problem List   Diagnosis Date Noted  . Viral URI with cough 03/31/2015  . Incidental lung nodule, > 3mm and < 8mm 03/12/2015  . Strain of thoracic paraspinal muscles excluding T1 and T2 levels 03/12/2015  . Hematuria 03/12/2015  . Encounter for smoking cessation counseling 03/12/2015  . Encounter for screening mammogram for breast cancer 12/13/2014  . Anxiety and depression 12/12/2014  . 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 Acid reflux 12/12/2014  . Cigarette smoker 12/12/2014  . Back pain, chronic 12/12/2014  . Arthritis 12/12/2014  . Radiculopathy of cervical spine 12/12/2014  . History of cervical cancer 12/12/2014  . Acute bronchospasm 12/12/2014  . Acute gastroenteritis 12/12/2014    Past Surgical History  Procedure Laterality Date  . Total abdominal hysterectomy   04/1998    Current Outpatient Rx  Name  Route  Sig  Dispense  Refill  . albuterol (PROVENTIL HFA;VENTOLIN HFA) 108 (90 BASE) MCG/ACT inhaler   Inhalation   Inhale 1-2 puffs into the lungs every 6 (six) hours as needed.         . citalopram (CELEXA) 20 MG tablet   Oral   Take 1 tablet by mouth daily.         . cyclobenzaprine (FLEXERIL) 10 MG tablet   Oral   Take 1 tablet (10 mg total) by mouth 3 (three) times daily as needed for muscle spasms.   60 tablet   5   . cyclobenzaprine (FLEXERIL) 10 MG tablet      TAKE 1 TABLET (10 MG TOTAL) BY MOUTH 3 (THREE) TIMES DAILY AS NEEDED FOR MUSCLE SPASMS.   50 tablet   5   . LORazepam (ATIVAN) 0.5 MG tablet      TAKE 1 TABLET BY MOUTH TWICE A DAY AS NEEDED   30 tablet   5     This request is for a new prescription for a contr ...   . nicotine (NICODERM CQ - DOSED IN MG/24 HOURS) 14 mg/24hr patch   Transdermal   Place 1 patch (14 mg total) onto the skin daily.   28 patch   2   . oxyCODONE-acetaminophen (PERCOCET/ROXICET) 5-325 MG tablet   Oral   Take 1 tablet by mouth every 8 (eight) hours as needed for severe  pain.   30 tablet   0     Refill 04/11/15   . traMADol (ULTRAM) 50 MG tablet   Oral   Take 1 tablet (50 mg total) by mouth 2 (two) times daily as needed for severe pain.   60 tablet   0     Call in refill 09/10/15, last office visit 03/2015 so ...     Allergies Sulfa antibiotics and Tomato  Family History  Problem Relation Age of Onset  . Alcohol abuse Mother   . COPD Mother   . Depression Mother   . Alcohol abuse Father   . Arthritis Father   . Diabetes Father   . Heart disease Father   . Hypertension Father   . Drug abuse Sister   . Hearing loss Son   . Heart disease Maternal Aunt   . Hyperlipidemia Maternal Aunt   . Asthma Paternal Aunt   . Cancer Paternal Aunt   . Diabetes Paternal Aunt   . Hypertension Paternal Aunt   . Stroke Paternal Aunt   . Arthritis Maternal Grandfather   . Heart  disease Maternal Grandfather   . Hypertension Maternal Grandfather   . Asthma Paternal Grandmother   . Diabetes Paternal Grandmother   . Heart disease Paternal Grandmother   . Stroke Paternal Grandmother   . Heart disease Paternal Grandfather     Social History Social History  Substance Use Topics  . Smoking status: Current Every Day Smoker    Types: Cigarettes  . Smokeless tobacco: Never Used  . Alcohol Use: No    Review of Systems  Constitutional: Negative for fever. Cardiovascular: Negative for chest pain. Respiratory: Negative for shortness of breath. Gastrointestinal: Negative for abdominal pain, vomiting and diarrhea. Genitourinary: Negative for dysuria. Musculoskeletal: Negative for back pain.Positive for foot pain Skin: Negative for rash. Neurological: Negative for headaches, focal weakness or numbness.  10-point ROS otherwise negative.  ____________________________________________   PHYSICAL EXAM:  VITAL SIGNS: ED Triage Vitals  Enc Vitals Group     BP 01/09/16 1929 135/79 mmHg     Pulse Rate 01/09/16 1929 75     Resp 01/09/16 1929 18     Temp 01/09/16 1929 98.1 F (36.7 C)     Temp Source 01/09/16 1929 Oral     SpO2 01/09/16 1929 98 %     Weight 01/09/16 1929 181 lb 11.2 oz (82.419 kg)     Height 01/09/16 1929 5\' 5"  (1.651 m)     Head Cir --      Peak Flow --      Pain Score 01/09/16 1930 6   Constitutional: Alert and oriented. Well appearing and in no distress. Eyes: Conjunctivae are normal. PERRL. Normal extraocular movements. ENT   Head: Normocephalic and atraumatic.   Nose: No congestion/rhinnorhea.   Mouth/Throat: Mucous membranes are moist.   Neck: No stridor. Hematological/Lymphatic/Immunilogical: No cervical lymphadenopathy. Cardiovascular: Normal rate, regular rhythm.  No murmurs, rubs, or gallops.Pulses 2+ in upper and lower extremity. Respiratory: Normal respiratory effort without tachypnea nor retractions. Breath sounds  are clear and equal bilaterally. No wheezes/rales/rhonchi. Gastrointestinal: Soft and nontender. No distention.  Genitourinary: Deferred Musculoskeletal: Normal range of motion in all extremities. No joint effusions.  No lower extremity tenderness nor edema. No appreciable swelling of the ankles. Patient has a small tender lesion to the bottom of her right foot. Dislocated roughly in the middle of the foot. No redness to this. No drainage expressed. No fluctuance. No warmth. Neurologic:  Normal speech and  language. No gross focal neurologic deficits are appreciated.  Skin:  Skin is warm, dry and intact. No rash noted. Psychiatric: Mood and affect are normal. Speech and behavior are normal. Patient exhibits appropriate insight and judgment.  ____________________________________________    LABS (pertinent positives/negatives)  Labs Reviewed  BASIC METABOLIC PANEL - Abnormal; Notable for the following:    Glucose, Bld 118 (*)    Creatinine, Ser 1.25 (*)    GFR calc non Af Amer 47 (*)    GFR calc Af Amer 55 (*)    All other components within normal limits  CBC     ____________________________________________   EKG  None  ____________________________________________    RADIOLOGY  Right foot  IMPRESSION: Mild degenerative change without acute abnormality.  I, Roxene Alviar, personally viewed and evaluated these images (plain radiographs) as part of my medical decision making. ____________________________________________   PROCEDURES  Procedure(s) performed: None  Critical Care performed: No  ____________________________________________   INITIAL IMPRESSION / ASSESSMENT AND PLAN / ED COURSE  Pertinent labs & imaging results that were available during my care of the patient were reviewed by me and considered in my medical decision making (see chart for details).  Patient presented to the emergency department today with primary complaint for right foot pain. Exam  patient does have a small tender lesion of unclear etiology. It certainly does not have any concerning infectious signs. Did not appreciate any foreign object to this area. Will plan on having patient follow up with podiatry. Patient's other complaints are again of unclear etiology. Did discuss with patient's importance of following up with primary care doctor. This point I do not think the pins and needles represent stroke given intermittent nature. Do not think they represent vascular occlusion give any good pulses.  ____________________________________________   FINAL CLINICAL IMPRESSION(S) / ED DIAGNOSES  Final diagnoses:  Right foot pain     Note: This dictation was prepared with Dragon dictation. Any transcriptional errors that result from this process are unintentional    Phineas Semen, MD 01/09/16 2152

## 2016-01-09 NOTE — ED Notes (Signed)
Pt reports she has a place on bottom of right foot x1655month - Pt states that she does not remember anything sticking her in the foot - she has a pinpoint area to the center of the bottom of her foot without redness or swelling noted - she states she is unable to walk on it but it only hurts when she walks on it - Last Saturday her right arm/shoulder became numb and tingling - the "going to sleep feeling comes and goes" - pt reports bilat feet/ankles swollen (rt started last Saturday - lt started 3 days ago) - Pt is A&O x4 and ambulated to room without difficulty or assistance

## 2016-01-09 NOTE — Discharge Instructions (Signed)
Please seek medical attention for any high fevers, chest pain, shortness of breath, change in behavior, persistent vomiting, bloody stool or any other new or concerning symptoms. ° ° °Musculoskeletal Pain °Musculoskeletal pain is muscle and boney aches and pains. These pains can occur in any part of the body. Your caregiver may treat you without knowing the cause of the pain. They may treat you if blood or urine tests, X-rays, and other tests were normal.  °CAUSES °There is often not a definite cause or reason for these pains. These pains may be caused by a type of germ (virus). The discomfort may also come from overuse. Overuse includes working out too hard when your body is not fit. Boney aches also come from weather changes. Bone is sensitive to atmospheric pressure changes. °HOME CARE INSTRUCTIONS  °· Ask when your test results will be ready. Make sure you get your test results. °· Only take over-the-counter or prescription medicines for pain, discomfort, or fever as directed by your caregiver. If you were given medications for your condition, do not drive, operate machinery or power tools, or sign legal documents for 24 hours. Do not drink alcohol. Do not take sleeping pills or other medications that may interfere with treatment. °· Continue all activities unless the activities cause more pain. When the pain lessens, slowly resume normal activities. Gradually increase the intensity and duration of the activities or exercise. °· During periods of severe pain, bed rest may be helpful. Lay or sit in any position that is comfortable. °· Putting ice on the injured area. °¨ Put ice in a bag. °¨ Place a towel between your skin and the bag. °¨ Leave the ice on for 15 to 20 minutes, 3 to 4 times a day. °· Follow up with your caregiver for continued problems and no reason can be found for the pain. If the pain becomes worse or does not go away, it may be necessary to repeat tests or do additional testing. Your caregiver  may need to look further for a possible cause. °SEEK IMMEDIATE MEDICAL CARE IF: °· You have pain that is getting worse and is not relieved by medications. °· You develop chest pain that is associated with shortness or breath, sweating, feeling sick to your stomach (nauseous), or throw up (vomit). °· Your pain becomes localized to the abdomen. °· You develop any new symptoms that seem different or that concern you. °MAKE SURE YOU:  °· Understand these instructions. °· Will watch your condition. °· Will get help right away if you are not doing well or get worse. °  °This information is not intended to replace advice given to you by your health care provider. Make sure you discuss any questions you have with your health care provider. °  °Document Released: 06/28/2005 Document Revised: 09/20/2011 Document Reviewed: 03/02/2013 °Elsevier Interactive Patient Education ©2016 [employer] ° °

## 2016-01-09 NOTE — ED Notes (Signed)
Urine collected Lm edt

## 2016-01-20 ENCOUNTER — Ambulatory Visit: Payer: BLUE CROSS/BLUE SHIELD | Admitting: Family Medicine

## 2016-01-26 ENCOUNTER — Emergency Department: Payer: Self-pay

## 2016-01-26 ENCOUNTER — Encounter: Payer: Self-pay | Admitting: *Deleted

## 2016-01-26 ENCOUNTER — Telehealth: Payer: Self-pay | Admitting: Cardiovascular Disease

## 2016-01-26 ENCOUNTER — Emergency Department
Admission: EM | Admit: 2016-01-26 | Discharge: 2016-01-26 | Disposition: A | Discharge status: Home / Self Care | Payer: Self-pay | Attending: Emergency Medicine | Admitting: Emergency Medicine

## 2016-01-26 DIAGNOSIS — R0789 Other chest pain: Secondary | ICD-10-CM | POA: Insufficient documentation

## 2016-01-26 DIAGNOSIS — J45909 Unspecified asthma, uncomplicated: Secondary | ICD-10-CM | POA: Insufficient documentation

## 2016-01-26 DIAGNOSIS — M1990 Unspecified osteoarthritis, unspecified site: Secondary | ICD-10-CM | POA: Insufficient documentation

## 2016-01-26 DIAGNOSIS — F17210 Nicotine dependence, cigarettes, uncomplicated: Secondary | ICD-10-CM | POA: Insufficient documentation

## 2016-01-26 DIAGNOSIS — F329 Major depressive disorder, single episode, unspecified: Secondary | ICD-10-CM | POA: Insufficient documentation

## 2016-01-26 DIAGNOSIS — R079 Chest pain, unspecified: Secondary | ICD-10-CM

## 2016-01-26 LAB — CBC
HEMATOCRIT: 39.4 % (ref 35.0–47.0)
Hemoglobin: 13.8 g/dL (ref 12.0–16.0)
MCH: 31.2 pg (ref 26.0–34.0)
MCHC: 35 g/dL (ref 32.0–36.0)
MCV: 89.2 fL (ref 80.0–100.0)
Platelets: 239 10*3/uL (ref 150–440)
RBC: 4.42 MIL/uL (ref 3.80–5.20)
RDW: 13.7 % (ref 11.5–14.5)
WBC: 7.5 10*3/uL (ref 3.6–11.0)

## 2016-01-26 LAB — BASIC METABOLIC PANEL WITH GFR
Anion gap: 6 (ref 5–15)
BUN: 12 mg/dL (ref 6–20)
CO2: 27 mmol/L (ref 22–32)
Calcium: 8.6 mg/dL — ABNORMAL LOW (ref 8.9–10.3)
Chloride: 106 mmol/L (ref 101–111)
Creatinine, Ser: 0.74 mg/dL (ref 0.44–1.00)
GFR calc Af Amer: >60 mL/min
GFR calc non Af Amer: >60 mL/min
Glucose, Bld: 97 mg/dL (ref 65–99)
Potassium: 4.1 mmol/L (ref 3.5–5.1)
Sodium: 139 mmol/L (ref 135–145)

## 2016-01-26 LAB — FIBRIN DERIVATIVES D-DIMER (ARMC ONLY): Fibrin derivatives D-dimer (ARMC): 349 (ref 0–499)

## 2016-01-26 LAB — TROPONIN I: Troponin I: <0.03 ng/mL

## 2016-01-26 MED ORDER — TRAMADOL HCL 50 MG PO TABS: 50.0000 mg | ORAL_TABLET | Freq: Four times a day (QID) | ORAL | Status: DC | PRN

## 2016-01-26 MED ORDER — KETOROLAC TROMETHAMINE 30 MG/ML IJ SOLN
30.0000 mg | Freq: Once | INTRAMUSCULAR | Status: AC
  Administered 2016-01-26: 30 mg via INTRAVENOUS
  Filled 2016-01-26: qty 1

## 2016-01-26 NOTE — Discharge Instructions (Signed)
Nonspecific Chest Pain  °Chest pain can be caused by many different conditions. There is always a chance that your pain could be related to something serious, such as a heart attack or a blood clot in your lungs. Chest pain can also be caused by conditions that are not life-threatening. If you have chest pain, it is very important to follow up with your health care provider. °CAUSES  °Chest pain can be caused by: °· Heartburn. °· Pneumonia or bronchitis. °· Anxiety or stress. °· Inflammation around your heart (pericarditis) or lung (pleuritis or pleurisy). °· A blood clot in your lung. °· A collapsed lung (pneumothorax). It can develop suddenly on its own (spontaneous pneumothorax) or from trauma to the chest. °· Shingles infection (varicella-zoster virus). °· Heart attack. °· Damage to the bones, muscles, and cartilage that make up your chest wall. This can include: °¨ Bruised bones due to injury. °¨ Strained muscles or cartilage due to frequent or repeated coughing or overwork. °¨ Fracture to one or more ribs. °¨ Sore cartilage due to inflammation (costochondritis). °RISK FACTORS  °Risk factors for chest pain may include: °· Activities that increase your risk for trauma or injury to your chest. °· Respiratory infections or conditions that cause frequent coughing. °· Medical conditions or overeating that can cause heartburn. °· Heart disease or family history of heart disease. °· Conditions or health behaviors that increase your risk of developing a blood clot. °· Having had chicken pox (varicella zoster). °SIGNS AND SYMPTOMS °Chest pain can feel like: °· Burning or tingling on the surface of your chest or deep in your chest. °· Crushing, pressure, aching, or squeezing pain. °· Dull or sharp pain that is worse when you move, cough, or take a deep breath. °· Pain that is also felt in your back, neck, shoulder, or arm, or pain that spreads to any of these areas. °Your chest pain may come and go, or it may stay  constant. °DIAGNOSIS °Lab tests or other studies may be needed to find the cause of your pain. Your health care provider may have you take a test called an ambulatory ECG (electrocardiogram). An ECG records your heartbeat patterns at the time the test is performed. You may also have other tests, such as: °· Transthoracic echocardiogram (TTE). During echocardiography, sound waves are used to create a picture of all of the heart structures and to look at how blood flows through your heart. °· Transesophageal echocardiogram (TEE). This is a more advanced imaging test that obtains images from inside your body. It allows your health care provider to see your heart in finer detail. °· Cardiac monitoring. This allows your health care provider to monitor your heart rate and rhythm in real time. °· Holter monitor. This is a portable device that records your heartbeat and can help to diagnose abnormal heartbeats. It allows your health care provider to track your heart activity for several days, if needed. °· Stress tests. These can be done through exercise or by taking medicine that makes your heart beat more quickly. °· Blood tests. °· Imaging tests. °TREATMENT  °Your treatment depends on what is causing your chest pain. Treatment may include: °· Medicines. These may include: °¨ Acid blockers for heartburn. °¨ Anti-inflammatory medicine. °¨ Pain medicine for inflammatory conditions. °¨ Antibiotic medicine, if an infection is present. °¨ Medicines to dissolve blood clots. °¨ Medicines to treat coronary artery disease. °· Supportive care for conditions that do not require medicines. This may include: °¨ Resting. °¨ Applying heat   or cold packs to injured areas. °¨ Limiting activities until pain decreases. °HOME CARE INSTRUCTIONS °· If you were prescribed an antibiotic medicine, finish it all even if you start to feel better. °· Avoid any activities that bring on chest pain. °· Do not use any tobacco products, including  cigarettes, chewing tobacco, or electronic cigarettes. If you need help quitting, ask your health care provider. °· Do not drink alcohol. °· Take medicines only as directed by your health care provider. °· Keep all follow-up visits as directed by your health care provider. This is important. This includes any further testing if your chest pain does not go away. °· If heartburn is the cause for your chest pain, you may be told to keep your head raised (elevated) while sleeping. This reduces the chance that acid will go from your stomach into your esophagus. °· Make lifestyle changes as directed by your health care provider. These may include: °¨ Getting regular exercise. Ask your health care provider to suggest some activities that are safe for you. °¨ Eating a heart-healthy diet. A registered dietitian can help you to learn healthy eating options. °¨ Maintaining a healthy weight. °¨ Managing diabetes, if necessary. °¨ Reducing stress. °SEEK MEDICAL CARE IF: °· Your chest pain does not go away after treatment. °· You have a rash with blisters on your chest. °· You have a fever. °SEEK IMMEDIATE MEDICAL CARE IF:  °· Your chest pain is worse. °· You have an increasing cough, or you cough up blood. °· You have severe abdominal pain. °· You have severe weakness. °· You faint. °· You have chills. °· You have sudden, unexplained chest discomfort. °· You have sudden, unexplained discomfort in your arms, back, neck, or jaw. °· You have shortness of breath at any time. °· You suddenly start to sweat, or your skin gets clammy. °· You feel nauseous or you vomit. °· You suddenly feel light-headed or dizzy. °· Your heart begins to beat quickly, or it feels like it is skipping beats. °These symptoms may represent a serious problem that is an emergency. Do not wait to see if the symptoms will go away. Get medical help right away. Call your local emergency services (911 in the [place] Do not drive yourself to the hospital. °  °This  information is not intended to replace advice given to you by your health care provider. Make sure you discuss any questions you have with your health care provider. °  °Document Released: 04/07/2005 Document Revised: 07/19/2014 Document Reviewed: 02/01/2014 °Elsevier Interactive Patient Education ©2016 [employer] ° °

## 2016-01-26 NOTE — ED Provider Notes (Signed)
[facility] Emergency Department Provider Note   ____________________________________________    I have reviewed the triage vital signs and the nursing notes.   HISTORY  Chief Complaint Chest Pain     HPI Tracy Winters is a 55 y.o. female who presents with complaints of chest pain. She feels it is in her chest wall. She reports a sharp pain especially when she moves or sits up that radiates around her neck and into her upper back. She denies shortness of breath. No diaphoresis. No injury to the area. She did recover from a URI approximately 2 weeks ago. No fevers or chills. No recent travel. No calf pain or swelling.   Past Medical History  Diagnosis Date  . Anxiety   . Arthritis   . Asthma   . Depression   . Allergy     patient takes otc Zyrtec & uses inhaler PRN  . Degenerative joint disease of low back     Patient Active Problem List   Diagnosis Date Noted  . Viral URI with cough 03/31/2015  . Incidental lung nodule, > 3mm and < 8mm 03/12/2015  . Strain of thoracic paraspinal muscles excluding T1 and T2 levels 03/12/2015  . Hematuria 03/12/2015  . Encounter for smoking cessation counseling 03/12/2015  . Encounter for screening mammogram for breast cancer 12/13/2014  . Anxiety and depression 12/12/2014  . 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 Acid reflux 12/12/2014  . Cigarette smoker 12/12/2014  . Back pain, chronic 12/12/2014  . Arthritis 12/12/2014  . Radiculopathy of cervical spine 12/12/2014  . History of cervical cancer 12/12/2014  . Acute bronchospasm 12/12/2014  . Acute gastroenteritis 12/12/2014    Past Surgical History  Procedure Laterality Date  . Total abdominal hysterectomy  04/1998    Current Outpatient Rx  Name  Route  Sig  Dispense  Refill  . LORazepam (ATIVAN) 0.5 MG tablet   Oral   Take 0.5 mg by mouth every 8 (eight) hours as needed for anxiety.         . traMADol (ULTRAM) 50 MG tablet    Oral   Take 1 tablet (50 mg total) by mouth every 6 (six) hours as needed.   20 tablet   0     Allergies Sulfa antibiotics and Tomato  Family History  Problem Relation Age of Onset  . Alcohol abuse Mother   . COPD Mother   . Depression Mother   . Alcohol abuse Father   . Arthritis Father   . Diabetes Father   . Heart disease Father   . Hypertension Father   . Drug abuse Sister   . Hearing loss Son   . Heart disease Maternal Aunt   . Hyperlipidemia Maternal Aunt   . Asthma Paternal Aunt   . Cancer Paternal Aunt   . Diabetes Paternal Aunt   . Hypertension Paternal Aunt   . Stroke Paternal Aunt   . Arthritis Maternal Grandfather   . Heart disease Maternal Grandfather   . Hypertension Maternal Grandfather   . Asthma Paternal Grandmother   . Diabetes Paternal Grandmother   . Heart disease Paternal Grandmother   . Stroke Paternal Grandmother   . Heart disease Paternal Grandfather     Social History Social History  Substance Use Topics  . Smoking status: Current Every Day Smoker    Types: Cigarettes  . Smokeless tobacco: Never Used  . Alcohol Use: No    Review of Systems  Constitutional: No  fever/chills Eyes: No visual changes.  ENT: No Neck pain Cardiovascular: As above Respiratory: Denies shortness of breath. Gastrointestinal: No abdominal pain.    Musculoskeletal: As above Skin: Negative for rash. Neurological: Negative for headaches   10-point ROS otherwise negative.  ____________________________________________   PHYSICAL EXAM:  VITAL SIGNS: ED Triage Vitals  Enc Vitals Group     BP 01/26/16 0958 121/74 mmHg     Pulse Rate 01/26/16 0958 74     Resp 01/26/16 0958 18     Temp 01/26/16 0958 98.2 F (36.8 C)     Temp Source 01/26/16 0958 Oral     SpO2 01/26/16 0958 99 %     Weight 01/26/16 0958 180 lb (81.647 kg)     Height 01/26/16 0958 5\' 4"  (1.626 m)     Head Cir --      Peak Flow --      Pain Score 01/26/16 0959 8     Pain Loc --       Pain Edu? --      Excl. in GC? --     Constitutional: Alert and oriented. No acute distress. Pleasant and interactive Eyes: Conjunctivae are normal.  Head: Atraumatic.Normocephalic Nose: No congestion/rhinnorhea. Mouth/Throat: Mucous membranes are moist.   Neck:  Painless ROM Cardiovascular: Normal rate, regular rhythm. Grossly normal heart sounds.  Good peripheral circulation.Tenderness to palpation along the lateral edges of the sternum  Respiratory: Normal respiratory effort.  No retractions. Lungs CTAB. Gastrointestinal: Soft and nontender. No distention.  No CVA tenderness. Genitourinary: deferred Musculoskeletal: No lower extremity tenderness nor edema.  Warm and well perfused Neurologic:  Normal speech and language. No gross focal neurologic deficits are appreciated.  Skin:  Skin is warm, dry and intact. No rash noted. Psychiatric: Mood and affect are normal. Speech and behavior are normal.  ____________________________________________   LABS (all labs ordered are listed, but only abnormal results are displayed)  Labs Reviewed  BASIC METABOLIC PANEL - Abnormal; Notable for the following:    Calcium 8.6 (*)    All other components within normal limits  CBC  TROPONIN I  FIBRIN DERIVATIVES D-DIMER ([facility] ONLY)  TROPONIN I   ____________________________________________  EKG  ED ECG REPORT I, Jene Every, the attending physician, personally viewed and interpreted this ECG.  Date: 01/26/2016 EKG Time: 9:54 AM Rate: 83 Rhythm: normal sinus rhythm QRS Axis: normal Intervals: normal ST/T Wave abnormalities: normal Conduction Disturbances: none Narrative Interpretation: unremarkable  ____________________________________________  RADIOLOGY  Chest x-ray unremarkable ____________________________________________   PROCEDURES  Procedure(s) performed: No    Critical Care performed: No ____________________________________________   INITIAL IMPRESSION /  ASSESSMENT AND PLAN / ED COURSE  Pertinent labs & imaging results that were available during my care of the patient were reviewed by me and considered in my medical decision making (see chart for details).  Patient presents with what appears to be chest wall pain based on history and exam. Overall the patient is well-appearing and her EKGs unremarkable. We will check a d-dimer, labs and reevaluate  Initial troponin is normal, d-dimer is normal. Chest x-ray is benign.  We will send a second troponin.  Patient treated with IV Toradol which significant helped her pain.  Second troponin normal.  Patient's pain is greatly improved. I feel she is appropriate for outpatient follow-up with cardiology. Return precautions discussed. Patient agrees the plan ____________________________________________   FINAL CLINICAL IMPRESSION(S) / ED DIAGNOSES  Final diagnoses:  Chest pain, unspecified chest pain type      NEW  MEDICATIONS STARTED DURING THIS VISIT:  New Prescriptions   TRAMADOL (ULTRAM) 50 MG TABLET    Take 1 tablet (50 mg total) by mouth every 6 (six) hours as needed.     Note:  This document was prepared using Dragon voice recognition software and may include unintentional dictation errors.    Jene Every, MD 01/26/16 1444

## 2016-01-26 NOTE — Telephone Encounter (Signed)
Tried to call patient to make ED fu from 01/26/16 for CP  No vm no answer. Will try again later.

## 2016-01-26 NOTE — ED Notes (Signed)
States mid chest pain radiating to her back that began this AM, states SOB when pain occurs, denies any personal hx but states cardiac hx in her family

## 2016-01-26 NOTE — ED Notes (Signed)
Patient drops heartrate to 45 at rest intermittently, asymptomatic.

## 2016-02-19 NOTE — Telephone Encounter (Signed)
Attempted to call patient - no answer - no vm

## 2016-03-23 ENCOUNTER — Encounter: Payer: Self-pay | Admitting: Family Medicine

## 2016-03-23 ENCOUNTER — Ambulatory Visit (INDEPENDENT_AMBULATORY_CARE_PROVIDER_SITE_OTHER): Payer: Self-pay | Admitting: Family Medicine

## 2016-03-23 DIAGNOSIS — F329 Major depressive disorder, single episode, unspecified: Secondary | ICD-10-CM

## 2016-03-23 DIAGNOSIS — M549 Dorsalgia, unspecified: Secondary | ICD-10-CM

## 2016-03-23 DIAGNOSIS — M1990 Unspecified osteoarthritis, unspecified site: Secondary | ICD-10-CM

## 2016-03-23 DIAGNOSIS — R635 Abnormal weight gain: Secondary | ICD-10-CM

## 2016-03-23 DIAGNOSIS — M25551 Pain in right hip: Secondary | ICD-10-CM

## 2016-03-23 DIAGNOSIS — F419 Anxiety disorder, unspecified: Secondary | ICD-10-CM

## 2016-03-23 DIAGNOSIS — R200 Anesthesia of skin: Secondary | ICD-10-CM

## 2016-03-23 DIAGNOSIS — G8929 Other chronic pain: Secondary | ICD-10-CM

## 2016-03-23 DIAGNOSIS — F418 Other specified anxiety disorders: Secondary | ICD-10-CM

## 2016-03-23 MED ORDER — MELOXICAM 15 MG PO TABS: 15.0000 mg | ORAL_TABLET | Freq: Every day | ORAL | 1 refills | Status: AC

## 2016-03-23 MED ORDER — SERTRALINE HCL 50 MG PO TABS: ORAL_TABLET | ORAL | 0 refills | Status: AC

## 2016-03-23 NOTE — Patient Instructions (Addendum)
Start the meloxicam and take one a day if needed for pain Take with food Do NOT take any other NSAIDs It is okay to take acetaminophen per package directions with the meloxicam Try turmeric as a natural anti-inflammatory (for pain and arthritis). It comes in capsules where you buy aspirin and fish oil, but also as a spice where you buy pepper and garlic powder. Try topicals for additional pain relief Check out [facility] [phone] If you develops signs/symptoms of stroke, call 911 Return in the next month for a complete physical or you can go to the health department or [facility] I do encourage you to quit smoking Call [phone] to sign up for smoking cessation classes You can call 1-800-QUIT-NOW to talk with a smoking cessation coach  Smoking Cessation, Tips for Success If you are ready to quit smoking, congratulations! You have chosen to help yourself be healthier. Cigarettes bring nicotine, tar, carbon monoxide, and other irritants into your body. Your lungs, heart, and blood vessels will be able to work better without these poisons. There are many different ways to quit smoking. Nicotine gum, nicotine patches, a nicotine inhaler, or nicotine nasal spray can help with physical craving. Hypnosis, support groups, and medicines help break the habit of smoking. WHAT THINGS CAN I DO TO MAKE QUITTING EASIER?  Here are some tips to help you quit for good:  Pick a date when you will quit smoking completely. Tell all of your friends and family about your plan to quit on that date.  Do not try to slowly cut down on the number of cigarettes you are smoking. Pick a quit date and quit smoking completely starting on that day.  Throw away all cigarettes.   Clean and remove all ashtrays from your home, work, and car.  On a card, write down your reasons for quitting. Carry the card with you and read it when you get the urge to smoke.  Cleanse your body of nicotine. Drink enough water  and fluids to keep your urine clear or pale yellow. Do this after quitting to flush the nicotine from your body.  Learn to predict your moods. Do not let a bad situation be your excuse to have a cigarette. Some situations in your life might tempt you into wanting a cigarette.  Never have "just one" cigarette. It leads to wanting another and another. Remind yourself of your decision to quit.  Change habits associated with smoking. If you smoked while driving or when feeling stressed, try other activities to replace smoking. Stand up when drinking your coffee. Brush your teeth after eating. Sit in a different chair when you read the paper. Avoid alcohol while trying to quit, and try to drink fewer caffeinated beverages. Alcohol and caffeine may urge you to smoke.  Avoid foods and drinks that can trigger a desire to smoke, such as sugary or spicy foods and alcohol.  Ask people who smoke not to smoke around you.  Have something planned to do right after eating or having a cup of coffee. For example, plan to take a walk or exercise.  Try a relaxation exercise to calm you down and decrease your stress. Remember, you may be tense and nervous for the first 2 weeks after you quit, but this will pass.  Find new activities to keep your hands busy. Play with a pen, coin, or rubber band. Doodle or draw things on paper.  Brush your teeth right after eating. This will help cut down  on the craving for the taste of tobacco after meals. You can also try mouthwash.   Use oral substitutes in place of cigarettes. Try using lemon drops, carrots, cinnamon sticks, or chewing gum. Keep them handy so they are available when you have the urge to smoke.  When you have the urge to smoke, try deep breathing.  Designate your home as a nonsmoking area.  If you are a heavy smoker, ask your health care provider about a prescription for nicotine chewing gum. It can ease your withdrawal from nicotine.  Reward yourself. Set  aside the cigarette money you save and buy yourself something nice.  Look for support from others. Join a support group or smoking cessation program. Ask someone at home or at work to help you with your plan to quit smoking.  Always ask yourself, "Do I need this cigarette or is this just a reflex?" Tell yourself, "Today, I choose not to smoke," or "I do not want to smoke." You are reminding yourself of your decision to quit.  Do not replace cigarette smoking with electronic cigarettes (commonly called e-cigarettes). The safety of e-cigarettes is unknown, and some may contain harmful chemicals.  If you relapse, do not give up! Plan ahead and think about what you will do the next time you get the urge to smoke. HOW WILL I FEEL WHEN I QUIT SMOKING? You may have symptoms of withdrawal because your body is used to nicotine (the addictive substance in cigarettes). You may crave cigarettes, be irritable, feel very hungry, cough often, get headaches, or have difficulty concentrating. The withdrawal symptoms are only temporary. They are strongest when you first quit but will go away within 10-14 days. When withdrawal symptoms occur, stay in control. Think about your reasons for quitting. Remind yourself that these are signs that your body is healing and getting used to being without cigarettes. Remember that withdrawal symptoms are easier to treat than the major diseases that smoking can cause.  Even after the withdrawal is over, expect periodic urges to smoke. However, these cravings are generally short lived and will go away whether you smoke or not. Do not smoke! WHAT RESOURCES ARE AVAILABLE TO HELP ME QUIT SMOKING? Your health care provider can direct you to community resources or hospitals for support, which may include:  Group support.  Education.  Hypnosis.  Therapy.   This information is not intended to replace advice given to you by your health care provider. Make sure you discuss any  questions you have with your health care provider.   Document Released: 03/26/2004 Document Revised: 07/19/2014 Document Reviewed: 12/14/2012 Elsevier Interactive Patient Education [employer]2016 [employer]

## 2016-03-23 NOTE — Progress Notes (Signed)
BP 120/74   Pulse 97   Temp 98.5 F (36.9 C)   Wt 185 lb (83.9 kg)   SpO2 95%   BMI 31.76 kg/m    Subjective:    Patient ID: Tracy Winters, female    DOB: 1961/07/03, 55 y.o.   MRN: [id number]  HPI: Tracy Winters is a 55 y.o. female  Chief Complaint  Patient presents with  . Medication Refill  . Hip Pain    start rt shoulder to rt ankle    Patient is brand new to me She says that she is here to get refills of ativan and tramadol She has anxiety attacks; she tries to psyche herself out and talks herself down; has been on zoloft before; stopped it a long time ago Never prescribed by doctor here; no problems with that  She has had numbness in her right side, from shoulder to right ankle; toes are fine; feet are fine; arm is fine, no sometimes she says it does go down to fingers; no trouble swallowing or speaking; no hx of stroke; father had a stroke, paternal aunt stroke too; paternal grandmother and paternal grandfather both had strokes too  Walking to the side because the pain in her right hip is worse; suddenly gotten worse; she has been on tramadol for a long time, 10 years; I offered xray, but patient declined; offered ortho, she declined  She has gained 30 pounds over the last 6 months She has not changed her eating habits She is a CNA, was working in assisted living; 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 Allergy    patient takes otc Zyrtec & uses inhaler PRN  . Anxiety   . Arthritis   . Asthma   . Degenerative joint disease of low back   .  Depression    Past Surgical History:  Procedure Laterality Date  . TOTAL ABDOMINAL HYSTERECTOMY  04/1998   Family History  Problem Relation Age of Onset  . Alcohol abuse Mother   . COPD Mother   . Depression Mother   . Alcohol abuse Father   . Arthritis Father   . Diabetes Father   . Heart disease Father   . Hypertension Father   . Drug abuse Sister   . Hearing loss Son   . Heart disease Maternal Aunt   . Hyperlipidemia Maternal Aunt   . Asthma Paternal Aunt   . Cancer Paternal Aunt   . Diabetes Paternal Aunt   . Hypertension Paternal Aunt   . Stroke Paternal Aunt   . Arthritis Maternal Grandfather   . Heart disease Maternal Grandfather   . Hypertension Maternal Grandfather   . Asthma Paternal Grandmother   . Diabetes Paternal Grandmother   . Heart disease Paternal Grandmother   . Stroke Paternal Grandmother   . Heart disease Paternal Grandfather    Social History  Substance Use Topics  . Smoking status: Current Every Day Smoker    Types: Cigarettes  . Smokeless tobacco: Never Used  . Alcohol use No  Interim medical history since last visit reviewed. Allergies and medications reviewed  Review of Systems Per HPI unless specifically indicated above     Objective:    BP 120/74   Pulse 97   Temp 98.5 F (36.9 C)   Wt 185 lb (83.9 kg)   SpO2 95%   BMI 31.76 kg/m   Wt Readings from Last 3 Encounters:  03/23/16 185 lb (83.9 kg)  01/26/16 180 lb (81.6 kg)  01/09/16 181 lb 11.2 oz (82.4 kg)    Physical Exam  Constitutional: She appears well-developed and well-nourished. No distress.  HENT:  Head: Normocephalic and atraumatic.  Eyes: EOM are normal. No scleral icterus.  Neck: No thyromegaly present.  Cardiovascular: Normal rate, regular rhythm and normal heart sounds.   No murmur heard. Pulmonary/Chest: Effort normal and breath sounds normal. No respiratory distress. She has no wheezes.  Abdominal: Soft. Bowel sounds are normal. She exhibits no  distension.  Musculoskeletal: Normal range of motion. She exhibits no edema.  Neurological: She is alert. She displays no atrophy and no tremor. She exhibits normal muscle tone. Gait normal.  No apparent weakness of the right side  Skin: Skin is warm and dry. She is not diaphoretic. No pallor.  Psychiatric: She has a normal mood and affect. Her behavior is normal. Judgment and thought content normal. Her mood appears not anxious. She does not exhibit a depressed mood. She expresses no homicidal and no suicidal ideation.   Results for orders placed or performed during the hospital encounter of 01/26/16  Basic metabolic panel  Result Value Ref Range   Sodium 139 135 - 145 mmol/L   Potassium 4.1 3.5 - 5.1 mmol/L   Chloride 106 101 - 111 mmol/L   CO2 27 22 - 32 mmol/L   Glucose, Bld 97 65 - 99 mg/dL   BUN 12 6 - 20 mg/dL   Creatinine, Ser 7.82 0.44 - 1.00 mg/dL   Calcium 8.6 (L) 8.9 - 10.3 mg/dL   GFR calc non Af Amer >60 >60 mL/min   GFR calc Af Amer >60 >60 mL/min   Anion gap 6 5 - 15  CBC  Result Value Ref Range   WBC 7.5 3.6 - 11.0 K/uL   RBC 4.42 3.80 - 5.20 MIL/uL   Hemoglobin 13.8 12.0 - 16.0 g/dL   HCT 95.6 21.3 - 08.6 %   MCV 89.2 80.0 - 100.0 fL   MCH 31.2 26.0 - 34.0 pg   MCHC 35.0 32.0 - 36.0 g/dL   RDW 57.8 46.9 - 62.9 %   Platelets 239 150 - 440 K/uL  Troponin I  Result Value Ref Range   Troponin I <0.03 <0.03 ng/mL  Fibrin derivatives D-Dimer ([facility] only)  Result Value Ref Range   Fibrin derivatives D-dimer ([facility]) 349 0 - 499  Troponin I  Result Value Ref Range   Troponin I <0.03 <0.03 ng/mL      Assessment & Plan:   Problem List Items Addressed This Visit      Musculoskeletal and Integument   Arthritis    Will use non-controlled methods for addressing chronic pain      Relevant Medications   meloxicam (MOBIC) 15 MG tablet     Other   Weight gain    Patient does not have insurance; I suggested checking thyroid function tests; patient will try to get  in [facility]      Right hip pain    Patient does not have insurance; we  discussed physical therapy, xrays; she will try to get in to [facility] where she can be evaluated and may be able to get free or reduced care      Numbness on right side    Patient does not have insurance; I explained that her symptoms do not follow a pattern to suggest a pinched nerve or radiculopathy, and that a head CT is warranted; she declined, concerned about the money and will try to get in to [facility] to have this evaluated and worked up; to ER if worsening or s/s of stroke      Back pain, chronic    Explained that I do not plan to prescribe tramadol for chronic back pain; she may use topicals, tyelnol, yoga, stretching, exercises, NSAIDs, ice/heat, turmeric, etc. For management of chronic pain      Relevant Medications   meloxicam (MOBIC) 15 MG tablet   Anxiety and depression    I am not willing to prescribe a benzodiazepine; I am willing to prescribe SSRI though, start medicine and contact me in a few weeks with update (she does not have insurance and would like to save a trip if possible); other methods for dealing with anxiety discussed       Other Visit Diagnoses   None.      Follow up plan: Return in about 1 month (around 04/22/2016) for complete physical.  An after-visit summary was printed and given to the patient at check-out.  Please see the patient instructions which may contain other information and recommendations beyond what is mentioned above in the assessment and plan.  Meds ordered this encounter  Medications  . meloxicam (MOBIC) 15 MG tablet    Sig: Take 1 tablet (15 mg total) by mouth daily. If needed for pain; take w/food; no other NSAIDs    Dispense:  30 tablet    Refill:  1  . sertraline (ZOLOFT) 50 MG tablet    Sig: One-half of a pill by mouth daily x 8 days, then one whole pill daily    Dispense:  30 tablet    Refill:  0    No orders  of the defined types were placed in this encounter.

## 2016-03-28 DIAGNOSIS — M25551 Pain in right hip: Secondary | ICD-10-CM | POA: Insufficient documentation

## 2016-03-28 DIAGNOSIS — R635 Abnormal weight gain: Secondary | ICD-10-CM | POA: Insufficient documentation

## 2016-03-28 DIAGNOSIS — R200 Anesthesia of skin: Secondary | ICD-10-CM | POA: Insufficient documentation

## 2016-03-28 NOTE — Assessment & Plan Note (Signed)
Explained that I do not plan to prescribe tramadol for chronic back pain; she may use topicals, tyelnol, yoga, stretching, exercises, NSAIDs, ice/heat, turmeric, etc. For management of chronic pain

## 2016-03-28 NOTE — Assessment & Plan Note (Signed)
Will use non-controlled methods for addressing chronic pain

## 2016-03-28 NOTE — Assessment & Plan Note (Signed)
Patient does not have insurance; I suggested checking thyroid function tests; patient will try to get in to [facility]

## 2016-03-28 NOTE — Assessment & Plan Note (Signed)
Patient does not have insurance; I explained that her symptoms do not follow a pattern to suggest a pinched nerve or radiculopathy, and that a head CT is warranted; she declined, concerned about the money and will try to get in to [facility] to have this evaluated and worked up; to ER if worsening or s/s of stroke

## 2016-03-28 NOTE — Assessment & Plan Note (Signed)
I am not willing to prescribe a benzodiazepine; I am willing to prescribe SSRI though, start medicine and contact me in a few weeks with update (she does not have insurance and would like to save a trip if possible); other methods for dealing with anxiety discussed

## 2016-03-28 NOTE — Assessment & Plan Note (Signed)
Patient does not have insurance; we discussed physical therapy, xrays; she will try to get in to [facility] where she can be evaluated and may be able to get free or reduced care

## 2016-09-27 ENCOUNTER — Emergency Department
Admission: EM | Admit: 2016-09-27 | Discharge: 2016-09-27 | Disposition: A | Discharge status: Home / Self Care | Payer: Self-pay | Attending: Emergency Medicine | Admitting: Emergency Medicine

## 2016-09-27 ENCOUNTER — Encounter: Payer: Self-pay | Admitting: Emergency Medicine

## 2016-09-27 DIAGNOSIS — J45909 Unspecified asthma, uncomplicated: Secondary | ICD-10-CM | POA: Insufficient documentation

## 2016-09-27 DIAGNOSIS — F17210 Nicotine dependence, cigarettes, uncomplicated: Secondary | ICD-10-CM | POA: Insufficient documentation

## 2016-09-27 DIAGNOSIS — J209 Acute bronchitis, unspecified: Secondary | ICD-10-CM | POA: Insufficient documentation

## 2016-09-27 MED ORDER — HYDROCOD POLST-CPM POLST ER 10-8 MG/5ML PO SUER: 5.0000 mL | Freq: Two times a day (BID) | ORAL | 0 refills | Status: AC

## 2016-09-27 MED ORDER — AZITHROMYCIN 250 MG PO TABS
ORAL_TABLET | ORAL | 0 refills | Status: AC
Start: 2016-09-27 — End: 2016-10-02

## 2016-09-27 MED ORDER — FLUCONAZOLE 150 MG PO TABS: 150.0000 mg | ORAL_TABLET | Freq: Every day | ORAL | 0 refills | Status: AC

## 2016-09-27 MED ORDER — IBUPROFEN 600 MG PO TABS: 600.0000 mg | ORAL_TABLET | Freq: Three times a day (TID) | ORAL | 0 refills | Status: AC | PRN

## 2016-09-27 NOTE — ED Notes (Signed)
See triage note. Developed cough and congestion since Saturday  Low grade fever on arrival

## 2016-09-27 NOTE — ED Provider Notes (Signed)
[facility] Emergency Department Provider Note   ____________________________________________   First MD Initiated Contact with Patient 09/27/16 1440     (approximate)  I have reviewed the triage vital signs and the nursing notes.   HISTORY  Chief Complaint Cough    HPI Tracy Winters is a 56 y.o. female patient complaining of 2 days of URI signs and symptoms consistent of cough and nasal congestion. Patient also state fever. Patient denies nausea ,vomiting, or diarrhea.Patient state cough increases with deep breathing. Patient also complaining of fatigue. Patient did not take a flu shot this season. Patient rates the pain and discomfort as a 6/10. Patient describes the pain as "achy". No palliative measures for her complaint. 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   Past Medical History:  Diagnosis Date  . Allergy    patient takes otc Zyrtec & uses inhaler PRN  . Anxiety   . Arthritis   . Asthma   . Degenerative joint disease of low back   . Depression     Patient Active Problem List   Diagnosis Date Noted  . Right hip pain 03/28/2016  . Numbness on right side 03/28/2016  . Weight gain 03/28/2016  . Incidental lung nodule, > 3mm and < 8mm 03/12/2015  . Hematuria 03/12/2015  . Encounter for smoking cessation counseling 03/12/2015  . Encounter for screening mammogram for breast cancer 12/13/2014  . Anxiety and depression 12/12/2014  . 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 Acid reflux 12/12/2014  . Cigarette smoker 12/12/2014  . Back pain, chronic 12/12/2014  . Arthritis 12/12/2014  . Radiculopathy of cervical spine 12/12/2014  . History of cervical cancer 12/12/2014    Past Surgical History:  Procedure Laterality Date  . TOTAL ABDOMINAL HYSTERECTOMY  04/1998    Prior to Admission medications   Medication Sig Start Date End Date Taking? Authorizing Provider  azithromycin (ZITHROMAX Z-PAK) 250 MG tablet Take 2 tablets (500 mg) on   Day 1,  followed by 1 tablet (250 mg) once daily on Days 2 through 5. 09/27/16 10/02/16  Joni Reining, PA-C  chlorpheniramine-HYDROcodone (TUSSIONEX PENNKINETIC ER) 10-8 MG/5ML SUER Take 5 mLs by mouth 2 (two) times daily. 09/27/16   Joni Reining, PA-C  ibuprofen (ADVIL,MOTRIN) 600 MG tablet Take 1 tablet (600 mg total) by mouth every 8 (eight) hours as needed. 09/27/16   Joni Reining, PA-C  meloxicam (MOBIC) 15 MG tablet Take 1 tablet (15 mg total) by mouth daily. If needed for pain; take w/food; no other NSAIDs 03/23/16   Kerman Passey, MD  sertraline (ZOLOFT) 50 MG tablet One-half of a pill by mouth daily x 8 days, then one whole pill daily 03/23/16   Kerman Passey, MD    Allergies Sulfa antibiotics and Tomato  Family History  Problem Relation Age of Onset  . Alcohol abuse Mother   . COPD Mother   . Depression Mother   . Alcohol abuse Father   . Arthritis Father   . Diabetes Father   . Heart disease Father   . Hypertension Father   . Drug abuse Sister   . Hearing loss Son   . Heart disease Maternal Aunt   . Hyperlipidemia Maternal Aunt   . Asthma Paternal Aunt   . Cancer Paternal Aunt   . Diabetes Paternal Aunt   . Hypertension Paternal Aunt   . Stroke Paternal Aunt   . Arthritis Maternal Grandfather   . Heart disease Maternal  Grandfather   . Hypertension Maternal Grandfather   . Asthma Paternal Grandmother   . Diabetes Paternal Grandmother   . Heart disease Paternal Grandmother   . Stroke Paternal Grandmother   . Heart disease Paternal Grandfather     Social History Social History  Substance Use Topics  . Smoking status: Current Every Day Smoker    Packs/day: 1.00    Types: Cigarettes  . Smokeless tobacco: Never Used  . Alcohol use No    Review of Systems Constitutional: Fever and chills. Body aches Eyes: No visual changes. ENT: No sore throat. Nasal congestion Cardiovascular: Denies chest pain. Respiratory: Denies shortness of breath. Nonproductive  cough. Gastrointestinal: No abdominal pain.  No nausea, no vomiting.  No diarrhea.  No constipation. Genitourinary: Negative for dysuria. Musculoskeletal: Negative for back pain. Skin: Negative for rash. Neurological: Negative for headaches, focal weakness or numbness. Psychiatric:Anxiety and depression Allergic/Immunilogical: Sulfa   ____________________________________________   PHYSICAL EXAM:  VITAL SIGNS: ED Triage Vitals  Enc Vitals Group     BP 09/27/16 1352 133/70     Pulse Rate 09/27/16 1352 (!) 104     Resp 09/27/16 1352 17     Temp 09/27/16 1352 (!) 100.6 F (38.1 C)     Temp Source 09/27/16 1352 Oral     SpO2 09/27/16 1352 97 %     Weight 09/27/16 1353 175 lb (79.4 kg)     Height 09/27/16 1353 5\' 4"  (1.626 m)     Head Circumference --      Peak Flow --      Pain Score 09/27/16 1356 6     Pain Loc --      Pain Edu? --      Excl. in GC? --     Constitutional: Alert and oriented. Mild distress. Febrile  Eyes: Conjunctivae are normal. PERRL. EOMI. Head: Atraumatic. Nose: Edematous nasal turbinates with bilateral maxillary guarding. Mouth/Throat: Mucous membranes are moist.  Oropharynx non-erythematous. Postnasal drainage Neck: No stridor.  No cervical spine tenderness to palpation. Hematological/Lymphatic/Immunilogical: No cervical lymphadenopathy. Cardiovascular: Normal rate, regular rhythm. Grossly normal heart sounds.  Good peripheral circulation. Respiratory: Normal respiratory effort.  No retractions. Lungs CTAB. Bilateral Rales. Gastrointestinal: Soft and nontender. No distention. No abdominal bruits. No CVA tenderness. Musculoskeletal: No lower extremity tenderness nor edema.  No joint effusions. Neurologic:  Normal speech and language. No gross focal neurologic deficits are appreciated. No gait instability. Skin:  Skin is warm, dry and intact. No rash noted. Psychiatric: Mood and affect are normal. Speech and behavior are  normal.  ____________________________________________   LABS (all labs ordered are listed, but only abnormal results are displayed)  Labs Reviewed - No data to display ____________________________________________  EKG   ____________________________________________  RADIOLOGY   ____________________________________________   PROCEDURES  Procedure(s) performed: None  Procedures  Critical Care performed: No  ____________________________________________   INITIAL IMPRESSION / ASSESSMENT AND PLAN / ED COURSE  Pertinent labs & imaging results that were available during my care of the patient were reviewed by me and considered in my medical decision making (see chart for details).  Bronchitis. Patient given discharge care instructions. Patient given prescription for Zithromax, Tussionex, and ibuprofen. Patient given a work note. Patient advised [facility].      ____________________________________________   FINAL CLINICAL IMPRESSION(S) / ED DIAGNOSES  Final diagnoses:  Acute bronchitis, unspecified organism      NEW MEDICATIONS STARTED DURING THIS VISIT:  New Prescriptions   AZITHROMYCIN (  ZITHROMAX Z-PAK) 250 MG TABLET    Take 2 tablets (500 mg) on  Day 1,  followed by 1 tablet (250 mg) once daily on Days 2 through 5.   CHLORPHENIRAMINE-HYDROCODONE (TUSSIONEX PENNKINETIC ER) 10-8 MG/5ML SUER    Take 5 mLs by mouth 2 (two) times daily.   IBUPROFEN (ADVIL,MOTRIN) 600 MG TABLET    Take 1 tablet (600 mg total) by mouth every 8 (eight) hours as needed.     Note:  This document was prepared using Dragon voice recognition software and may include unintentional dictation errors.    Joni Reining, PA-C 09/27/16 1453    Rockne Menghini, MD 09/27/16 [phone]

## 2016-09-27 NOTE — ED Triage Notes (Signed)
Pt from home with cough and URI symptoms since Saturday. Pt states she has had congested cough and nasal congestion. Pt alert & oriented with NAD noted.

## 2017-11-05 IMAGING — CR DG CHEST 2V
1 series · 2 of 2 positions shown · non-contrast
Comparison: None.

CLINICAL DATA: Chest pain starting this morning, shortness of
Breath

EXAM:
CHEST  2 VIEW

[Series 1: w chest pa · 0.14mm/px · 2 of 2 slices shown]
[im 1/2]
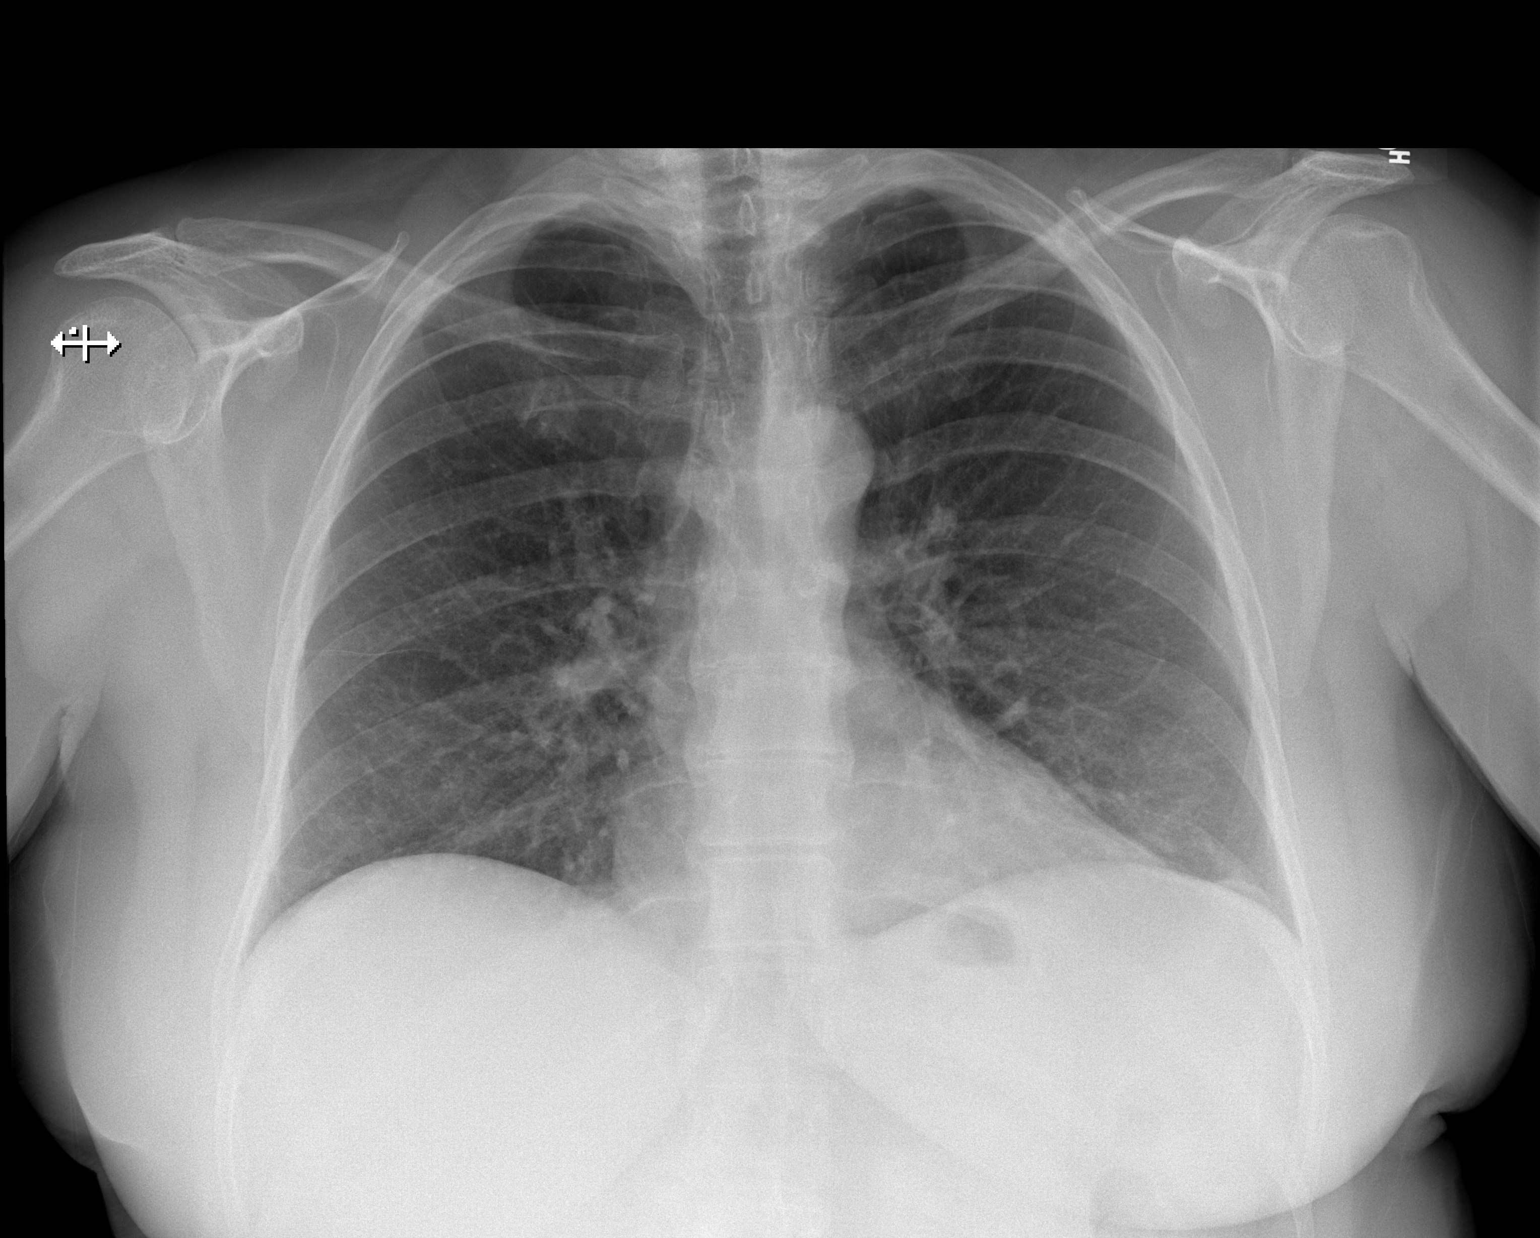
[im 2/2]
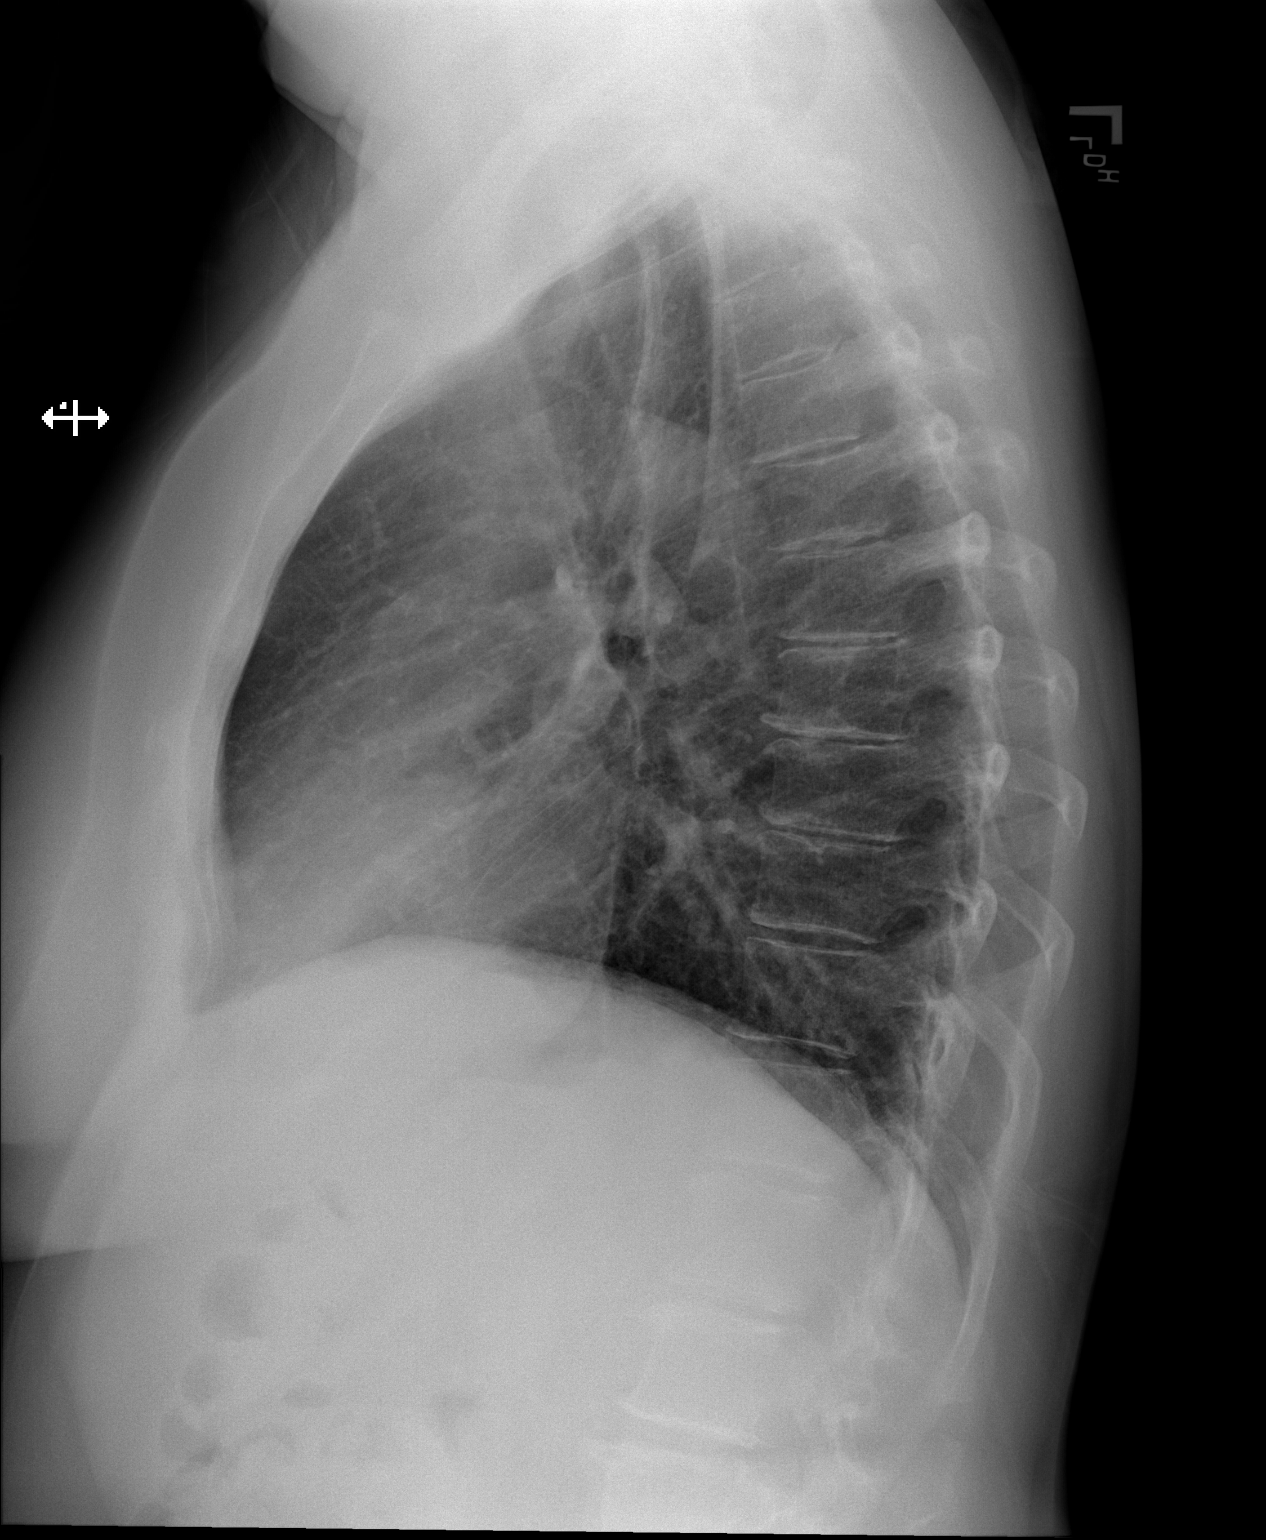

[2 of 2 positions shown; findings below may reference images not displayed]

FINDINGS: Cardiomediastinal silhouette is unremarkable. No acute infiltrate or
pleural effusion. No pulmonary edema. Mild degenerative changes
thoracic spine.
IMPRESSION: No active cardiopulmonary disease.
